# Patient Record
Sex: Female | Born: 1970 | Race: White | Hispanic: No | Smoking: Never smoker
Health system: Southern US, Community
[De-identification: ages and names within clinical notes are randomized; demographics above are authoritative.]

## PROBLEM LIST (undated history)

## (undated) DIAGNOSIS — J45909 Unspecified asthma, uncomplicated: Secondary | ICD-10-CM

## (undated) DIAGNOSIS — R51 Headache: Secondary | ICD-10-CM

## (undated) DIAGNOSIS — G8929 Other chronic pain: Secondary | ICD-10-CM

## (undated) DIAGNOSIS — M1990 Unspecified osteoarthritis, unspecified site: Secondary | ICD-10-CM

## (undated) DIAGNOSIS — E079 Disorder of thyroid, unspecified: Secondary | ICD-10-CM

## (undated) DIAGNOSIS — G43909 Migraine, unspecified, not intractable, without status migrainosus: Secondary | ICD-10-CM

## (undated) DIAGNOSIS — R011 Cardiac murmur, unspecified: Secondary | ICD-10-CM

## (undated) HISTORY — DX: Cardiac murmur, unspecified: R01.1

## (undated) HISTORY — DX: Migraine, unspecified, not intractable, without status migrainosus: G43.909

## (undated) HISTORY — DX: Unspecified asthma, uncomplicated: J45.909

## (undated) HISTORY — DX: Headache: R51

## (undated) HISTORY — DX: Other chronic pain: G89.29

## (undated) HISTORY — DX: Unspecified osteoarthritis, unspecified site: M19.90

## (undated) HISTORY — DX: Disorder of thyroid, unspecified: E07.9

## (undated) HISTORY — PX: BREAST ENHANCEMENT SURGERY: SHX7

---

## 2012-01-11 LAB — BASIC METABOLIC PANEL
BUN: 10 mg/dL (ref 4–21)
CREATININE: 0.7 mg/dL (ref 0.5–1.1)
Glucose: 90 mg/dL
POTASSIUM: 4.7 mmol/L (ref 3.4–5.3)
SODIUM: 139 mmol/L (ref 137–147)

## 2012-01-11 LAB — LIPID PANEL
CHOLESTEROL: 192 mg/dL (ref 0–200)
HDL: 73 mg/dL — AB (ref 35–70)
LDL Cholesterol: 92 mg/dL
Triglycerides: 137 mg/dL (ref 40–160)

## 2012-01-11 LAB — HEPATIC FUNCTION PANEL
ALT: 25 U/L (ref 7–35)
AST: 24 U/L (ref 13–35)

## 2012-01-11 LAB — TSH: TSH: 2.9 u[IU]/mL (ref 0.41–5.90)

## 2012-01-11 LAB — CBC AND DIFFERENTIAL: WBC: 8 10^3/mL

## 2012-05-24 ENCOUNTER — Encounter: Payer: Self-pay | Admitting: Internal Medicine

## 2012-05-24 ENCOUNTER — Ambulatory Visit (INDEPENDENT_AMBULATORY_CARE_PROVIDER_SITE_OTHER): Payer: BC Managed Care – PPO | Admitting: Internal Medicine

## 2012-05-24 VITALS — BP 112/60 | HR 66 | Temp 98.2°F | Ht 63.5 in | Wt 190.0 lb

## 2012-05-24 DIAGNOSIS — E669 Obesity, unspecified: Secondary | ICD-10-CM | POA: Insufficient documentation

## 2012-05-24 DIAGNOSIS — R42 Dizziness and giddiness: Secondary | ICD-10-CM

## 2012-05-24 DIAGNOSIS — E559 Vitamin D deficiency, unspecified: Secondary | ICD-10-CM

## 2012-05-24 DIAGNOSIS — E66811 Obesity, class 1: Secondary | ICD-10-CM

## 2012-05-24 DIAGNOSIS — I10 Essential (primary) hypertension: Secondary | ICD-10-CM | POA: Insufficient documentation

## 2012-05-24 DIAGNOSIS — E039 Hypothyroidism, unspecified: Secondary | ICD-10-CM

## 2012-05-24 DIAGNOSIS — D696 Thrombocytopenia, unspecified: Secondary | ICD-10-CM

## 2012-05-24 LAB — CBC WITH DIFFERENTIAL/PLATELET
Basophils Relative: 0.4 % (ref 0.0–3.0)
Eosinophils Absolute: 0.1 10*3/uL (ref 0.0–0.7)
Hemoglobin: 14.5 g/dL (ref 12.0–15.0)
Lymphs Abs: 3.2 10*3/uL (ref 0.7–4.0)
MCHC: 32.7 g/dL (ref 30.0–36.0)
MCV: 99.5 fl (ref 78.0–100.0)
Monocytes Absolute: 0.5 10*3/uL (ref 0.1–1.0)
Neutro Abs: 4.3 10*3/uL (ref 1.4–7.7)
RBC: 4.46 Mil/uL (ref 3.87–5.11)

## 2012-05-24 MED ORDER — ERGOCALCIFEROL 1.25 MG (50000 UT) PO CAPS: 50000.0000 [IU] | ORAL_CAPSULE | ORAL | Status: DC

## 2012-05-24 NOTE — Assessment & Plan Note (Signed)
Secondary to Hashimoto's thyroiditis.  Dose of medication to be adjusted upward foe goal tsh of 1.0

## 2012-05-24 NOTE — Assessment & Plan Note (Signed)
bp in both arms 138/100,  given her episodes of dizziness, am ordering a 24 hr ambulatory bp monitor

## 2012-05-24 NOTE — Progress Notes (Signed)
Patient ID: Isabella Hester, female   DOB: 1971-05-21, 41 y.o.   MRN: [id number]  Patient Active Problem List  Diagnosis  . Vitamin D deficiency  . Hypothyroidism  . Hypertension  . Obesity (BMI 30.0-34.9)    Subjective:  CC:   Chief Complaint  Patient presents with  . Establish Care    HPI:   Isabella Hester is a 41 y.o. female who presents as a new patient to establish primary care with the chief complaint of   1) Thyroid problems. Saw endocrine for management of hashimoto's thyroiditis  And was taken off medications but having trouble with energy and dizziness.  NOw on a compounded thyroid medication bc Synthroid gave her a terrible headache.,  takes 12.5 mcg T4 twice daily  And her last TSH was 3.0.  She is the daughter of [name].  Relocated here from [place]  After a recent divorce .  2) Overweight, Since the birth of her daughter [name] was born  7.5 yrs ago she has had dificulty losing weight  And maintaining weight loss .   She has gained 20 lbs since then.  Despite working out daily on treadmill and using a Systems analyst since then.  Drinks a protein shake in the morning,  Low carb but adds fruit, including bananas, to it.  Eats 6 small meals daily, low carb.    3) Gets monthly menstrual migraines which returned after giving birth 7 years ago.  Wants to avoid medication .  4) Dizzy and light headed for the last several weeks  Works from home for [employer].    Not orthostatic  .  Bp actually 138/100 in both arms.  Very difficult to hear.    Notes that palpitations  occurred more frequently on higher dose of T4 but cognition was sharper.  Past Medical History  Diagnosis Date  . Asthma   . Arthritis   . Heart murmur   . Chronic headaches   . Migraines   . Thyroid disease     Past Surgical History  Procedure Date  . Breast enhancement surgery     saline    Family History  Problem Relation Age of Onset  . Cancer Father     Lung  . Heart disease Father   . Mental  illness Maternal Grandmother     dementia  . Cancer Paternal Grandmother 67    colon Ca  . Cancer Paternal Grandfather     bone mets unnown primary    History   Social History  . Marital Status: Legally Separated    Spouse Name: N/A    Number of Children: N/A  . Years of Education: N/A   Occupational History  . Not on file.   Social History Main Topics  . Smoking status: Never Smoker   . Smokeless tobacco: Not on file  . Alcohol Use: 0.6 oz/week    1 Glasses of wine per week  . Drug Use: No  . Sexually Active: Not on file   Other Topics Concern  . Not on file   Social History Narrative  . No narrative on file         @ALLHX @    Review of Systems:   The remainder of the review of systems was negative except those addressed in the HPI.       Objective:  BP 112/60  Pulse 66  Temp 98.2 F (36.8 C)  Ht 5' 3.5" (1.613 m)  Wt 190 lb (86.183 kg)  BMI 33.13 kg/m2  SpO2 98%  LMP 05/04/2012  General appearance: alert, cooperative and appears stated age Ears: normal TM's and external ear canals both ears Throat: lips, mucosa, and tongue normal; teeth and gums normal Neck: no adenopathy, no carotid bruit, supple, symmetrical, trachea midline and thyroid not enlarged, symmetric, no tenderness/mass/nodules Back: symmetric, no curvature. ROM normal. No CVA tenderness. Lungs: clear to auscultation bilaterally Heart: regular rate and rhythm, S1, S2 normal, no murmur, click, rub or gallop Abdomen: soft, non-tender; bowel sounds normal; no masses,  no organomegaly Pulses: 2+ and symmetric Skin: Skin color, texture, turgor normal. No rashes or lesions Lymph nodes: Cervical, supraclavicular, and axillary nodes normal.  Assessment and Plan:  Hypothyroidism Secondary to Hashimoto's thyroiditis.  Dose of medication to be adjusted upward foe goal tsh of 1.0  Hypertension bp in both arms 138/100,  given her episodes of dizziness, am ordering a 24 hr ambulatory bp  monitor  Obesity (BMI 30.0-34.9) Patient is already following a low glycemic index diet utilizing smaller more frequent meals to increase metabolism.  She is  exercising daily  a minimum of 5 days per week. Screening for lipid disorders, and diabetes has been done ,  Will adjust thyroid dose.Marland Kitchen     Updated Medication List Outpatient Encounter Prescriptions as of 05/24/2012  Medication Sig Dispense Refill  . ergocalciferol (DRISDOL) 50000 UNITS capsule Take 1 capsule (50,000 Units total) by mouth once a week.  4 capsule  2  . zolmitriptan (ZOMIG) 5 MG tablet Place 5 mg into the nose as needed.         Orders Placed This Encounter  Procedures  . HM MAMMOGRAPHY  . HM PAP SMEAR  . CBC with Differential  . Cortisol  . Ambulatory referral to Cardiology    Return in about 3 months (around 08/24/2012).

## 2012-05-24 NOTE — Assessment & Plan Note (Signed)
Patient is already following a low glycemic index diet utilizing smaller more frequent meals to increase metabolism.  She is  exercising daily  a minimum of 5 days per week. Screening for lipid disorders, and diabetes has been done ,  Will adjust thyroid dose.Marland Kitchen

## 2012-05-24 NOTE — Patient Instructions (Signed)
We will set you up with an ambulatory BP monitor to wear for a full 24 hous to see how much your bp is fluctuating.  This is  Dr. Melina Schools version of a  "Low GI"  Diet:  All of the foods can be found at grocery stores and in bulk at [employer].  The Atkins protein bars and shakes are available in more varieties at [employer], [employer] and [employer].     7 AM Breakfast:  Low carbohydrate Protein  Shakes (I recommend the EAS AdvantEdge "Carb Control" shakes  Or the low carb shakes by Atkins.   Both are available everywhere:  In  cases at [employer]Js  Or in 4 packs at grocery stores and pharmacies  2.5 carbs  (Alternative is  a toasted Arnold's Sandwhich Thin w/ peanut butter, a "Bagel Thin" with cream cheese and salmon) or  a scrambled egg burrito made with a low carb tortilla .  Avoid cereal and bananas, oatmeal too unless you are cooking the old fashioned kind that takes 30-40 minutes to prepare.  the rest is overly processed, has minimal fiber, and is loaded with carbohydrates!   10 AM: Protein bar by Atkins (the snack size, under 200 cal).  There are many varieties , available widely again or in bulk in limited varieties at [employer]Js)  Other so called "protein bars" tend to be loaded with carbohydrates.  Remember, in food advertising, the word "energy" is synonymous for " carbohydrate."  Lunch: sandwich of [place], (or any lunchmeat, grilled meat or canned tuna), fresh avocado, mayonnaise  and cheese on a lower carbohydrate pita bread, flatbread, or tortilla . Ok to use regular mayonnaise. The bread is the only source or carbohydrate that can be decreased (Joseph's makes a pita bread and a flat bread that are 50 cal and 4 net carbs ; Toufayan makes a low carb flatbread that's 100 cal and 9 net carbs  and  Mission makes a low carb whole wheat tortilla  That is 210 cal and 6 net carbs)  3 PM:  Mid day :  Another protein bar,  Or a  cheese stick (100 cal, 0 carbs),  Or 1 ounce of  almonds, walnuts, pistachios, pecans,  peanuts,  Macadamia nuts. Or a Dannon light n Fit greek yogurt, 80 cal 8 net carbs . Avoid "granola"; the dried cranberries and raisins are loaded with carbohydrates. Mixed nuts ok if no raisins or cranberries or dried fruit.      6 PM  Dinner:  "mean and green:"  Meat/chicken/fish or a high protein legume; , with a green salad, and a low GI  Veggie (broccoli, cauliflower, green beans, spinach, brussel sprouts. Lima beans) : Avoid "Low fat dressings, as well as Reyne Dumas and [address]! They are loaded with sugar! Instead use ranch, vinagrette,  Blue cheese, etc  9 PM snack : Breyer's "low carb" fudgsicle or  ice cream bar (Carb Smart line), or  Weight Watcher's ice cream bar , or another "no sugar added" ice cream;a serving of fresh berries/cherries with whipped cream (Avoid bananas, pineapple, grapes  and watermelon on a regular basis because they are high in sugar)   Remember that snack Substitutions should be less than 15 to 20 carbs  Per serving. Remember to subtract fiber grams and sugar alcohols to get the "net carbs."

## 2012-05-25 DIAGNOSIS — D696 Thrombocytopenia, unspecified: Secondary | ICD-10-CM | POA: Insufficient documentation

## 2012-05-25 NOTE — Assessment & Plan Note (Signed)
Confirmed with repeat CBC. Etiology unclear. Referral to hematologist for further testing.

## 2012-05-25 NOTE — Addendum Note (Signed)
Addended by: Sherlene Shams on: 05/25/2012 06:51 AM   Modules accepted: Orders

## 2012-06-07 ENCOUNTER — Ambulatory Visit: Payer: Self-pay | Admitting: Oncology

## 2012-06-07 ENCOUNTER — Encounter (INDEPENDENT_AMBULATORY_CARE_PROVIDER_SITE_OTHER): Payer: BC Managed Care – PPO

## 2012-06-07 DIAGNOSIS — R42 Dizziness and giddiness: Secondary | ICD-10-CM

## 2012-06-07 LAB — CBC CANCER CENTER
Basophil #: 0.1 x10 3/mm (ref 0.0–0.1)
Basophil %: 1 %
Eosinophil #: 0.2 x10 3/mm (ref 0.0–0.7)
Eosinophil %: 2.2 %
HGB: 14.5 g/dL (ref 12.0–16.0)
Lymphocyte %: 32.6 %
MCH: 33 pg (ref 26.0–34.0)
Monocyte #: 0.8 x10 3/mm (ref 0.2–0.9)
Neutrophil %: 54.8 %
Platelet: 142 x10 3/mm — ABNORMAL LOW (ref 150–440)
RBC: 4.41 10*6/uL (ref 3.80–5.20)
RDW: 12.1 % (ref 11.5–14.5)

## 2012-06-07 LAB — IRON AND TIBC
Iron Bind.Cap.(Total): 356 ug/dL (ref 250–450)
Unbound Iron-Bind.Cap.: 294 ug/dL

## 2012-06-07 LAB — LACTATE DEHYDROGENASE: LDH: 185 U/L (ref 81–246)

## 2012-06-20 ENCOUNTER — Ambulatory Visit: Payer: Self-pay | Admitting: Oncology

## 2012-06-21 ENCOUNTER — Telehealth: Payer: Self-pay | Admitting: Internal Medicine

## 2012-06-21 NOTE — Telephone Encounter (Signed)
Patient notified via phone of blood pressure reading

## 2012-06-21 NOTE — Telephone Encounter (Signed)
Her 24 hour blood pressure monitor was normal. Does not need medications at this time.

## 2012-06-30 ENCOUNTER — Telehealth: Payer: Self-pay | Admitting: Internal Medicine

## 2012-06-30 NOTE — Telephone Encounter (Signed)
Her ambulator bp monitor results were normal.  Blood pressure fluctuations were never below normal range except for a brief time at 4 am.

## 2012-06-30 NOTE — Telephone Encounter (Signed)
Spoke to patient gave her instructions as directed.

## 2012-07-12 ENCOUNTER — Ambulatory Visit (INDEPENDENT_AMBULATORY_CARE_PROVIDER_SITE_OTHER): Payer: BC Managed Care – PPO | Admitting: Adult Health

## 2012-07-12 ENCOUNTER — Encounter: Payer: Self-pay | Admitting: Adult Health

## 2012-07-12 VITALS — BP 114/80 | HR 77 | Temp 98.2°F | Ht 63.0 in | Wt 185.0 lb

## 2012-07-12 DIAGNOSIS — R05 Cough: Secondary | ICD-10-CM

## 2012-07-12 MED ORDER — HYDROCODONE-HOMATROPINE 5-1.5 MG/5ML PO SYRP: 5.0000 mL | ORAL_SOLUTION | Freq: Three times a day (TID) | ORAL | Status: DC | PRN

## 2012-07-12 MED ORDER — LEVOFLOXACIN 500 MG PO TABS: 500.0000 mg | ORAL_TABLET | Freq: Every day | ORAL | Status: DC

## 2012-07-12 NOTE — Patient Instructions (Addendum)
  Please have nebulizer treatment today prior to leaving the office. Start your antibiotic tonight.  You can use your albuterol nebulizer at home every 6 hours as needed.  Also, start your inhaler (Qvar) tonight and then use twice daily. Rinse your mouth after each use.  Call if your symptoms do not improve within 3-4 days.  Keep hydrated by drinking fluids throughout the day.

## 2012-07-12 NOTE — Assessment & Plan Note (Addendum)
Suspect bacterial origin given purulent sputum and little resolution of symptoms in 7 days. Albuterol nebulizer treatment given in office and continue at home q 6 hours prn for chest tightness, cough. Start Levaquin. Hycodan for cough.

## 2012-07-12 NOTE — Progress Notes (Signed)
  Subjective:    Patient ID: Isabella Hester, female    DOB: 11-20-1970, 41 y.o.   MRN: [id number]  HPI  Patient is a very pleasant 41 y/o female with hx of asthma, hypothyroidism who presents to clinic with symptoms of general body aches/pain, low grade fever, chills, productive cough of greenish sputum, chest tightness. Cough is worse at night. She has not been able to sleep secondary to this cough. Patient reports that her 63 y/o daughter was sick with similar symptoms. Pt has not been able to get rid of it. She has been taking Aleve for body aches.   Current Outpatient Prescriptions on File Prior to Visit  Medication Sig Dispense Refill  . ergocalciferol (DRISDOL) 50000 UNITS capsule Take 1 capsule (50,000 Units total) by mouth once a week.  4 capsule  2  . zolmitriptan (ZOMIG) 5 MG tablet Place 5 mg into the nose as needed.         Review of Systems  Constitutional: Positive for fever, chills and appetite change.  HENT: Positive for congestion and postnasal drip. Negative for ear pain, rhinorrhea, neck stiffness and ear discharge.   Eyes: Negative.   Respiratory: Positive for cough, chest tightness, shortness of breath and wheezing.   Cardiovascular: Negative for chest pain and palpitations.  Gastrointestinal: Positive for nausea and diarrhea. Negative for vomiting.  Genitourinary: Negative.   Musculoskeletal:       General body aches/pains  Skin: Negative for pallor and rash.  Neurological: Positive for headaches. Negative for dizziness and light-headedness.  Psychiatric/Behavioral: Negative for behavioral problems and agitation. The patient is not nervous/anxious.      BP 114/80  Pulse 77  Temp 98.2 F (36.8 C) (Oral)  Ht 5\' 3"  (1.6 m)  Wt 185 lb (83.915 kg)  BMI 32.77 kg/m2  SpO2 95%  LMP 07/04/2012     Objective:     Physical Exam  Constitutional: She is oriented to person, place, and time. She appears well-developed and well-nourished. No distress.  HENT:   Head: Normocephalic.       Bilateral bulging TM; pharyngeal erythema, cobblestone appearance  Eyes: Conjunctivae normal are normal. Right eye exhibits no discharge. Left eye exhibits no discharge. No scleral icterus.  Neck: No tracheal deviation present.  Cardiovascular: Normal rate, regular rhythm and normal heart sounds.  Exam reveals no gallop.   No murmur heard. Pulmonary/Chest: Effort normal. No respiratory distress. She has wheezes. She exhibits no tenderness.       Positive for rhonchi bilateral posterior fields  Abdominal: Soft. Bowel sounds are normal. There is no tenderness.  Musculoskeletal: Normal range of motion.  Lymphadenopathy:    She has no cervical adenopathy.  Neurological: She is alert and oriented to person, place, and time. Coordination normal.  Skin: Skin is warm and dry. No rash noted. No erythema.  Psychiatric: She has a normal mood and affect. Her behavior is normal. Judgment and thought content normal.       Assessment & Plan:

## 2012-07-13 ENCOUNTER — Telehealth: Payer: Self-pay | Admitting: Adult Health

## 2012-07-13 NOTE — Telephone Encounter (Signed)
Called to check on patient. She reports feeling stomach upset (heartburn, then feeling nauseated) last night with the medication. She took levaquin and also took hycodan. Hard to distinguish which one of these may have contributed to the problem. Instructed to take the levaquin on a full stomach and far enough apart from the hycodan to see if this happens again. She is aware we will not be in the office tomorrow. Advised to call us on Thursday if she is still having problems. Also instructed her to take Zantac 150 mg to see if this lessens stomach upset.

## 2012-07-15 ENCOUNTER — Encounter: Payer: Self-pay | Admitting: Internal Medicine

## 2012-08-21 ENCOUNTER — Other Ambulatory Visit: Payer: Self-pay | Admitting: Internal Medicine

## 2012-09-04 ENCOUNTER — Other Ambulatory Visit: Payer: Self-pay

## 2012-09-09 ENCOUNTER — Ambulatory Visit: Payer: Self-pay | Admitting: Oncology

## 2012-11-20 ENCOUNTER — Other Ambulatory Visit: Payer: Self-pay | Admitting: Internal Medicine

## 2013-03-31 ENCOUNTER — Other Ambulatory Visit: Payer: Self-pay | Admitting: Internal Medicine

## 2013-05-26 ENCOUNTER — Other Ambulatory Visit: Payer: Self-pay

## 2013-07-23 ENCOUNTER — Other Ambulatory Visit: Payer: Self-pay | Admitting: Internal Medicine

## 2013-10-10 ENCOUNTER — Telehealth: Payer: Self-pay | Admitting: Internal Medicine

## 2013-10-10 NOTE — Telephone Encounter (Signed)
Nope, they can do that ,  I have never done it .

## 2013-10-10 NOTE — Telephone Encounter (Signed)
Please advise 

## 2013-10-10 NOTE — Telephone Encounter (Signed)
Pt called stating she was having her breast implants removed and they told her that her primary care dr could take the drainage tubes out Her surgery date is 12/02/13  She stated she would need to get the tubes removed 12/12/13

## 2013-10-11 NOTE — Telephone Encounter (Signed)
Patient notified not performed in this office.

## 2013-10-27 ENCOUNTER — Ambulatory Visit: Payer: Self-pay | Admitting: Oncology

## 2013-10-28 LAB — CBC CANCER CENTER
BASOS ABS: 0 x10 3/mm (ref 0.0–0.1)
BASOS PCT: 0.5 %
EOS ABS: 0.1 x10 3/mm (ref 0.0–0.7)
Eosinophil %: 1.5 %
HCT: 42.5 % (ref 35.0–47.0)
HGB: 14.2 g/dL (ref 12.0–16.0)
LYMPHS PCT: 38.7 %
Lymphocyte #: 3 x10 3/mm (ref 1.0–3.6)
MCH: 32.3 pg (ref 26.0–34.0)
MCHC: 33.4 g/dL (ref 32.0–36.0)
MCV: 97 fL (ref 80–100)
Monocyte #: 0.5 x10 3/mm (ref 0.2–0.9)
Monocyte %: 6.1 %
NEUTROS ABS: 4.1 x10 3/mm (ref 1.4–6.5)
Neutrophil %: 53.2 %
Platelet: 152 x10 3/mm (ref 150–440)
RBC: 4.39 10*6/uL (ref 3.80–5.20)
RDW: 11.7 % (ref 11.5–14.5)
WBC: 7.7 x10 3/mm (ref 3.6–11.0)

## 2013-11-18 ENCOUNTER — Ambulatory Visit: Payer: Self-pay | Admitting: Oncology

## 2014-03-30 LAB — BASIC METABOLIC PANEL
BUN: 11 mg/dL (ref 4–21)
Creatinine: 0.8 mg/dL (ref 0.5–1.1)
Glucose: 99 mg/dL
Potassium: 4.7 mmol/L (ref 3.4–5.3)
Sodium: 139 mmol/L (ref 137–147)

## 2014-03-30 LAB — HEPATIC FUNCTION PANEL
ALT: 44 U/L — AB (ref 7–35)
AST: 34 U/L (ref 13–35)
Alkaline Phosphatase: 33 U/L (ref 25–125)

## 2014-03-30 LAB — TSH: TSH: 2.27 u[IU]/mL (ref 0.41–5.90)

## 2014-03-30 LAB — CBC AND DIFFERENTIAL
Hemoglobin: 14.5 g/dL (ref 12.0–16.0)
Platelets: 157 10*3/uL (ref 150–399)
WBC: 6.4 10^3/mL

## 2014-05-26 ENCOUNTER — Telehealth: Payer: Self-pay | Admitting: Internal Medicine

## 2014-05-26 MED ORDER — RIZATRIPTAN BENZOATE 5 MG PO TABS: 5.0000 mg | ORAL_TABLET | ORAL | Status: DC | PRN

## 2014-05-26 NOTE — Telephone Encounter (Signed)
Please advise 

## 2014-05-26 NOTE — Telephone Encounter (Signed)
Pt sent to scheduling for appoint. Aware of Rx

## 2014-05-26 NOTE — Telephone Encounter (Signed)
Isabella Hester called saying she has migraines and has run out of her medication. She's currently taking Zomig but would like to try Maxalt. She said she's heard good reviews about the medication. I told her the first appt I saw would be on Dec. 23rd and she's wondering if she can be seen before then. Her last migraine "episode" last three days in a row when she took her last Zomig. Please call the patient. Pt ph# [phone] Thank you.

## 2014-05-26 NOTE — Telephone Encounter (Signed)
No I cannot work her in any earlier,  Because she has not been seen in over 2 years. . I will send the maxAlt to her pharmacy but she needs to keep the dec 23 appt

## 2014-05-29 ENCOUNTER — Telehealth: Payer: Self-pay | Admitting: *Deleted

## 2014-05-29 NOTE — Telephone Encounter (Signed)
[facility] Left detailed message for patient.

## 2014-07-12 ENCOUNTER — Ambulatory Visit (INDEPENDENT_AMBULATORY_CARE_PROVIDER_SITE_OTHER): Payer: BC Managed Care – PPO | Admitting: Internal Medicine

## 2014-07-12 ENCOUNTER — Encounter: Payer: Self-pay | Admitting: Internal Medicine

## 2014-07-12 VITALS — BP 110/78 | HR 84 | Temp 99.1°F | Resp 14 | Ht 63.0 in | Wt 194.2 lb

## 2014-07-12 DIAGNOSIS — G43009 Migraine without aura, not intractable, without status migrainosus: Secondary | ICD-10-CM

## 2014-07-12 DIAGNOSIS — E038 Other specified hypothyroidism: Secondary | ICD-10-CM

## 2014-07-12 DIAGNOSIS — Z9882 Breast implant status: Secondary | ICD-10-CM

## 2014-07-12 DIAGNOSIS — R030 Elevated blood-pressure reading, without diagnosis of hypertension: Secondary | ICD-10-CM

## 2014-07-12 DIAGNOSIS — Z9109 Other allergy status, other than to drugs and biological substances: Secondary | ICD-10-CM

## 2014-07-12 DIAGNOSIS — T8543XS Leakage of breast prosthesis and implant, sequela: Secondary | ICD-10-CM

## 2014-07-12 DIAGNOSIS — D696 Thrombocytopenia, unspecified: Secondary | ICD-10-CM

## 2014-07-12 DIAGNOSIS — E669 Obesity, unspecified: Secondary | ICD-10-CM

## 2014-07-12 MED ORDER — LEVOTHYROXINE SODIUM 75 MCG PO TABS: 75.0000 ug | ORAL_TABLET | Freq: Every day | ORAL | Status: DC

## 2014-07-12 MED ORDER — DIAZEPAM 5 MG PO TABS: 5.0000 mg | ORAL_TABLET | Freq: Two times a day (BID) | ORAL | Status: DC | PRN

## 2014-07-12 MED ORDER — ONDANSETRON 8 MG PO TBDP: 8.0000 mg | ORAL_TABLET | Freq: Three times a day (TID) | ORAL | Status: DC | PRN

## 2014-07-12 MED ORDER — ELETRIPTAN HYDROBROMIDE 40 MG PO TABS: 40.0000 mg | ORAL_TABLET | Freq: Once | ORAL | Status: DC

## 2014-07-12 MED ORDER — METOPROLOL TARTRATE 25 MG PO TABS: 25.0000 mg | ORAL_TABLET | Freq: Every day | ORAL | Status: DC

## 2014-07-12 NOTE — Assessment & Plan Note (Signed)
24 hour ambulatory monitor of BP  Report done in  2013 was normal

## 2014-07-12 NOTE — Patient Instructions (Addendum)
We are starting metoprolol 25 mg at bedtime for migraine prevention   Trial of  valium for muscle tension at onset of headache  5 mg   relpax max dose at onset of headache  increase  levothyroxine to 75 mcg ,  1.5 tablets and recheck tsh in 6 week  Trial of allegra for mold allergy  (180 mg is the average  Dose for allergies )

## 2014-07-12 NOTE — Progress Notes (Signed)
Patient ID: Isabella Hester, female   DOB: Sep 14, 1970, 43 y.o.   MRN: [id number]  Patient Active Problem List   Diagnosis Date Noted  . Migraine without aura and without status migrainosus, not intractable 07/15/2014  . S/P silicone breast implant 07/15/2014  . 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 Elevated blood-pressure reading without diagnosis of hypertension 07/12/2014  . Thrombocytopenia 05/25/2012  . Vitamin D deficiency 05/24/2012  . Hypothyroidism 05/24/2012  . Obesity (BMI 30.0-34.9) 05/24/2012    Subjective:  CC:   Chief Complaint  Patient presents with  . Follow-up    last seen 07/12/12, having migraine headaches    HPI:   Isabella Hester is a 43 y.o. female who presents for  Follow up on multiple issues including migraine headaches, hypothyroidism, Weight gain,  And thrombocytopenia.  Last seen in 2013     She continues to have Monthly migraines, lasting 3 days , accompanied by  nausea and vomiting.  Previously managed with nasal imitrex , requesting either a refill or alternative.  maxalt trial was unsuccessful.  Long history of  Generalized persistent  Malaise leading to significant  weight gain attributed to systemic reaction leaking silicone breast implants from 2003 which were  finally removed in April 2015.  By Domenica Fail, MD in [place].   Patient was treated by same surgeon with with nystatin oral  suspension and lamisil oral tablets for 6 weeks  according some detox protocol. However, patient stopped the medications after several weeks due to feeling bloated and wt gain of 25 lbs.  She has recently started to feel better, but continues to have monthly migraines , which  Started in puberty,  Triggered by smells including burning wood and mold. Currently migraines occurring  with onset of her menstrual period and are preceded by bilateral shoulder tension,  Then an occipital headache followed by migraine and and  nausea.    She was evaluated by Dr. Orlie Dakin in 2013 for thrombocytopenia  And again in 2015.  She does not agree with his diagnosis .  This was discussed today   Past Medical History  Diagnosis Date  . Asthma   . Arthritis   . Heart murmur   . Chronic headaches   . Migraines   . Thyroid disease     Past Surgical History  Procedure Laterality Date  . Breast enhancement surgery      saline       The following portions of the patient's history were reviewed and updated as appropriate: Allergies, current medications, and problem list.    Review of Systems:   Patient denies headache, fevers, malaise, unintentional weight loss, skin rash, eye pain, sinus congestion and sinus pain, sore throat, dysphagia,  hemoptysis , cough, dyspnea, wheezing, chest pain, palpitations, orthopnea, edema, abdominal pain, nausea, melena, diarrhea, constipation, flank pain, dysuria, hematuria, urinary  Frequency, nocturia, numbness, tingling, seizures,  Focal weakness, Loss of consciousness,  Tremor, insomnia, depression, anxiety, and suicidal ideation.     History   Social History  . Marital Status: Legally Separated    Spouse Name: N/A    Number of Children: N/A  . Years of Education: N/A   Occupational History  . Not on file.   Social History Main Topics  . Smoking status: Never Smoker   . Smokeless tobacco: Not on file  . Alcohol Use: 0.6 oz/week    1 Glasses of wine per week  . Drug Use: No  . Sexual  Activity: Not on file   Other Topics Concern  . Not on file   Social History Narrative    Objective:  Filed Vitals:   07/12/14 1616  BP: 110/78  Pulse: 84  Temp: 99.1 F (37.3 C)  Resp: 14     General appearance: alert, cooperative and appears stated age Ears: normal TM's and external ear canals both ears Throat: lips, mucosa, and tongue normal; teeth and gums normal Neck: no adenopathy, no carotid bruit, supple, symmetrical, trachea midline and thyroid not enlarged,  symmetric, no tenderness/mass/nodules Back: symmetric, no curvature. ROM normal. No CVA tenderness. Lungs: clear to auscultation bilaterally Heart: regular rate and rhythm, S1, S2 normal, no murmur, click, rub or gallop Abdomen: soft, non-tender; bowel sounds normal; no masses,  no organomegaly Pulses: 2+ and symmetric Skin: Skin color, texture, turgor normal. No rashes or lesions Lymph nodes: Cervical, supraclavicular, and axillary nodes normal.  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 Christian Mateim Finnegan , after comprehensivve workup was negative for cause.  She was  reevaluated in 2015 after her plastic surgeon noted a drop to 40K preoperatively.  It is unclear whether the drop was due to collection in the wrong tube.  Dr. Orlie DakinFinnegan noted complete resolution when sample was collected in a citrate tube    Elevated blood-pressure reading without diagnosis of hypertension 24 hour ambulatory monitor of BP  Report done in  2013 was normal   Hypothyroidism Patient takes T3 and t3 ,  Previously managed by a PA an an outside clinic.  recent TSH is 2.27,    Will increase Synthroid to 75 mcg daily  To see if we can get her TSH to 1.0 to improve her metabolism and energy level. Repeat tsh in 6 weeks.    Migraine without aura and without status migrainosus, not intractable Occurring monthly and causing 3 days per moth of productivity loss due to severe symptoms. Discussed trial of nightly  metoprolol for prevention , relpax and valium for abortion.  Zofran for nausea  Obesity (BMI 30.0-34.9) I have addressed  BMI and recommended wt loss of 10% of body weigh over the next 6 months using a low glycemic index diet and regular exercise a minimum of 5 days per week.  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 Patient was also treated with an antifungal protocol that involved nystatin and lamasil, which she did not complete.  Has had LFTs since then in September which noted mild ALt elevation.  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 Updated Medication List Outpatient Encounter Prescriptions as of 07/12/2014  Medication Sig  . Ascorbic Acid (VITAMIN C) 1000 MG tablet Take 1,000 mg by mouth daily.  . Cholecalciferol ([facility] VIT D 5000 HIGH-POTENCY PO) Take 1 capsule by mouth daily.  Marland Kitchen. levothyroxine (SYNTHROID, LEVOTHROID) 75 MCG tablet Take 1 tablet (75  mcg total) by mouth daily before breakfast.  . zolmitriptan (ZOMIG) 5 MG tablet Place 5 mg into the nose as needed.  . [DISCONTINUED] levothyroxine (SYNTHROID, LEVOTHROID) 50 MCG tablet Take 50 mcg by mouth daily before breakfast.  . [DISCONTINUED] rizatriptan (MAXALT) 5 MG tablet Take 1 tablet (5 mg total) by mouth as needed for migraine. May repeat in 2 hours if needed  . diazepam (VALIUM) 5 MG tablet Take 1 tablet (5 mg total) by mouth every 12 (twelve) hours as needed for anxiety.  Marland Kitchen. eletriptan (RELPAX) 40 MG tablet Take 1 tablet (40 mg total) by mouth once. One tablet by mouth at onset of headache.  . metoprolol tartrate (LOPRESSOR) 25 MG tablet Take 1 tablet (25 mg total) by mouth at bedtime.  . ondansetron (ZOFRAN ODT) 8 MG disintegrating tablet Take 1 tablet (8 mg total) by mouth every 8 (eight) hours as needed for nausea or vomiting.  . [DISCONTINUED] HYDROcodone-homatropine (HYCODAN) 5-1.5 MG/5ML syrup Take 5 mLs by mouth every 8 (eight) hours as needed  for cough.  . [DISCONTINUED] levofloxacin (LEVAQUIN) 500 MG tablet Take 1 tablet (500 mg total) by mouth daily. Take 1 tablet daily for 10 days.  . [DISCONTINUED] Vitamin D, Ergocalciferol, (DRISDOL) 50000 UNITS CAPS capsule TAKE 1 CAPSULE (50,000 UNITS TOTAL) BY MOUTH ONCE A WEEK.     Orders Placed This Encounter  Procedures  . TSH  . CBC and differential  . Basic metabolic panel  . Lipid panel  . Hepatic function panel  . T4 AND TSH  . CBC and differential  . Basic metabolic panel  . Hepatic function panel  . TSH    No Follow-up on file.

## 2014-07-12 NOTE — Assessment & Plan Note (Addendum)
Secondary to platelet aggregation per  2013 by Dr. Christian Mateim Finnegan , after comprehensivve workup was negative for cause.  She was  reevaluated in 2015 after her plastic surgeon noted a drop to 40K preoperatively.  It is unclear whether the drop was due to collection in the wrong tube.  Dr. Orlie DakinFinnegan noted complete resolution when sample was collected in a citrate tube

## 2014-07-15 DIAGNOSIS — G43009 Migraine without aura, not intractable, without status migrainosus: Secondary | ICD-10-CM | POA: Insufficient documentation

## 2014-07-15 DIAGNOSIS — T8543XA Leakage of breast prosthesis and implant, initial encounter: Secondary | ICD-10-CM | POA: Insufficient documentation

## 2014-07-15 DIAGNOSIS — Z9882 Breast implant status: Secondary | ICD-10-CM | POA: Insufficient documentation

## 2014-07-15 DIAGNOSIS — Z9109 Other allergy status, other than to drugs and biological substances: Secondary | ICD-10-CM | POA: Insufficient documentation

## 2014-07-15 NOTE — Assessment & Plan Note (Addendum)
Patient takes T3 and t3 ,  Previously managed by a PA an an outside clinic.  recent TSH is 2.27,    Will increase Synthroid to 75 mcg daily  To see if we can get her TSH to 1.0 to improve her metabolism and energy level. Repeat tsh in 6 weeks.

## 2014-07-15 NOTE — Assessment & Plan Note (Signed)
Occurring monthly and causing 3 days per moth of productivity loss due to severe symptoms. Discussed trial of nightly  metoprolol for prevention , relpax and valium for abortion.  Zofran for nausea

## 2014-07-15 NOTE — Assessment & Plan Note (Signed)
S/p [facility]. Patient was also treated with an antifungal protocol that involved nystatin and lamasil, which she did not complete.  Has had LFTs since then in September which noted mild ALt elevation.  Lab Results  Component Value Date   ALT 44* 03/30/2014   AST 34 03/30/2014   ALKPHOS 33 03/30/2014

## 2014-07-15 NOTE — Assessment & Plan Note (Addendum)
I have addressed  BMI and recommended wt loss of 10% of body weigh over the next 6 months using a low glycemic index diet and regular exercise a minimum of 5 days per week.  Wt Readings from Last 3 Encounters:  07/12/14 194 lb 4 oz (88.111 kg)  07/12/12 185 lb (83.915 kg)  05/24/12 190 lb (86.183 kg)

## 2014-07-15 NOTE — Assessment & Plan Note (Signed)
Advised to try Allegra 180 mg daily

## 2014-08-28 ENCOUNTER — Other Ambulatory Visit: Payer: Self-pay

## 2014-08-28 ENCOUNTER — Ambulatory Visit: Payer: Self-pay | Admitting: Internal Medicine

## 2014-08-30 ENCOUNTER — Encounter: Payer: Self-pay | Admitting: Internal Medicine

## 2014-08-30 MED ORDER — ZOLMITRIPTAN 5 MG NA SOLN: 1.0000 | NASAL | Status: DC | PRN

## 2014-09-21 ENCOUNTER — Ambulatory Visit: Payer: Self-pay | Admitting: Family Medicine

## 2015-03-27 LAB — CBC AND DIFFERENTIAL
HCT: 42 % (ref 36–46)
Hemoglobin: 14.3 g/dL (ref 12.0–16.0)
Neutrophils Absolute: 4 /uL
PLATELETS: 123 10*3/uL — AB (ref 150–399)
WBC: 7 10*3/mL

## 2015-03-27 LAB — BASIC METABOLIC PANEL
BUN: 11 mg/dL (ref 4–21)
CREATININE: 0.7 mg/dL (ref 0.5–1.1)
GLUCOSE: 92 mg/dL
POTASSIUM: 4.6 mmol/L (ref 3.4–5.3)
SODIUM: 141 mmol/L (ref 137–147)

## 2015-03-27 LAB — TSH: TSH: 3.22 u[IU]/mL (ref 0.41–5.90)

## 2015-03-27 LAB — LIPID PANEL
Cholesterol: 229 mg/dL — AB (ref 0–200)
HDL: 65 mg/dL (ref 35–70)
LDL CALC: 134 mg/dL
TRIGLYCERIDES: 152 mg/dL (ref 40–160)

## 2015-03-27 LAB — HEPATIC FUNCTION PANEL
ALT: 16 U/L (ref 7–35)
AST: 20 U/L (ref 13–35)
Alkaline Phosphatase: 40 U/L (ref 25–125)
BILIRUBIN, TOTAL: 0.2 mg/dL

## 2015-03-27 LAB — HEMOGLOBIN A1C: HEMOGLOBIN A1C: 5.4 % (ref 4.0–6.0)

## 2015-03-30 ENCOUNTER — Other Ambulatory Visit: Payer: Self-pay | Admitting: Internal Medicine

## 2015-03-30 NOTE — Telephone Encounter (Signed)
Last TSH 9.10.16.  Never returned for labs ordered 12.23.15.  Please advise refill

## 2015-03-31 NOTE — Telephone Encounter (Signed)
correction last tsh 03/30/14.  Refill one month only needs

## 2015-04-02 NOTE — Telephone Encounter (Signed)
Left message on VM to return call 

## 2015-06-01 ENCOUNTER — Other Ambulatory Visit: Payer: Self-pay | Admitting: Internal Medicine

## 2015-06-01 DIAGNOSIS — I10 Essential (primary) hypertension: Secondary | ICD-10-CM

## 2015-06-01 DIAGNOSIS — E034 Atrophy of thyroid (acquired): Secondary | ICD-10-CM

## 2015-06-01 DIAGNOSIS — E785 Hyperlipidemia, unspecified: Secondary | ICD-10-CM

## 2015-06-01 DIAGNOSIS — E559 Vitamin D deficiency, unspecified: Secondary | ICD-10-CM

## 2015-06-01 NOTE — Telephone Encounter (Signed)
Refill for 30 days only.  FASTING LABS AND OFFICE VISIT NEEDED or to any more refills

## 2015-06-01 NOTE — Telephone Encounter (Signed)
Please advise refill? 

## 2015-07-02 ENCOUNTER — Other Ambulatory Visit: Payer: Self-pay | Admitting: Internal Medicine

## 2015-07-03 ENCOUNTER — Telehealth: Payer: Self-pay | Admitting: Internal Medicine

## 2015-07-03 MED ORDER — LEVOTHYROXINE SODIUM 75 MCG PO TABS: ORAL_TABLET | ORAL | Status: DC

## 2015-07-03 NOTE — Telephone Encounter (Signed)
Pt came in to drop off lab results placed in Dr. Ceasar Lundullos box.. Please advise pt if you have any questions..Marland Kitchen

## 2015-07-03 NOTE — Telephone Encounter (Signed)
IN yellow folder abstracted this is the patient whom was requesting refill on levothyroxine see note 07/02/15 refill.

## 2015-07-03 NOTE — Telephone Encounter (Signed)
Patient has an appointment scheduled for 07/09/15 for medication OV, patient has recently had labs and is going to bring copy to  Office. FYI

## 2015-07-03 NOTE — Telephone Encounter (Signed)
Refill denied because it appears that  we have already done the "30 days only"  Document that patient has been called,  Labs have been ordered

## 2015-07-09 ENCOUNTER — Encounter: Payer: Self-pay | Admitting: Internal Medicine

## 2015-07-09 ENCOUNTER — Ambulatory Visit (INDEPENDENT_AMBULATORY_CARE_PROVIDER_SITE_OTHER): Payer: BLUE CROSS/BLUE SHIELD | Admitting: Internal Medicine

## 2015-07-09 VITALS — BP 122/84 | HR 88 | Temp 98.0°F | Resp 12 | Ht 63.0 in | Wt 194.5 lb

## 2015-07-09 DIAGNOSIS — Z9109 Other allergy status, other than to drugs and biological substances: Secondary | ICD-10-CM | POA: Diagnosis not present

## 2015-07-09 DIAGNOSIS — B999 Unspecified infectious disease: Secondary | ICD-10-CM | POA: Diagnosis not present

## 2015-07-09 DIAGNOSIS — E038 Other specified hypothyroidism: Secondary | ICD-10-CM

## 2015-07-09 DIAGNOSIS — E034 Atrophy of thyroid (acquired): Secondary | ICD-10-CM

## 2015-07-09 DIAGNOSIS — D696 Thrombocytopenia, unspecified: Secondary | ICD-10-CM

## 2015-07-09 DIAGNOSIS — E669 Obesity, unspecified: Secondary | ICD-10-CM

## 2015-07-09 MED ORDER — NYSTATIN-TRIAMCINOLONE 100000-0.1 UNIT/GM-% EX OINT: 1.0000 "application " | TOPICAL_OINTMENT | Freq: Two times a day (BID) | CUTANEOUS | Status: DC

## 2015-07-09 MED ORDER — FEXOFENADINE HCL 180 MG PO TABS: 180.0000 mg | ORAL_TABLET | Freq: Every day | ORAL | Status: AC

## 2015-07-09 MED ORDER — MONTELUKAST SODIUM 10 MG PO TABS: 10.0000 mg | ORAL_TABLET | Freq: Every day | ORAL | Status: DC

## 2015-07-09 NOTE — Progress Notes (Signed)
Pre-visit discussion using our clinic review tool. No additional management support is needed unless otherwise documented below in the visit note.  

## 2015-07-09 NOTE — Patient Instructions (Addendum)
I suggest trying Allegra and Singulair (generic forms of both) on a daily basis to see if it helps modulate your inflammatory response to the mold.  I am checking your IgG, IgA, and IgM levels today to evaluate your immune system

## 2015-07-09 NOTE — Progress Notes (Signed)
Subjective:  Patient ID: Isabella Hester, female    DOB: Aug 12, 1970  Age: 44 y.o. MRN: [id number]  CC: The primary encounter diagnosis was Recurrent infections. 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  HPI Isabella Hester presents for one year follow up on multiple issues, including hypothyroidism, migraine headaches, obesity and hypertension.  Not taking .  Migraines worse over the weekend.  Never started the lopressor for headache prevention. ,  Didn't have any until recently,  Seeing a chiropractor. Who has been treating her thyroid with a diet.  Still taking levothyroxine. Also treating her  For  EBV .   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  Surgeon thinks it is an immune response  to mold . Takes a lot of natural immune system enhancers including [employer]  And a mushroom extract (cytokine production ), which she reports helps her feel better,   if she goes without it she gets sick   Wants to have breast topography rather than mammogram due to pain issues.  and PAP smear  No history of abnormals.   Outpatient Prescriptions Prior to Visit  Medication Sig Dispense Refill  . Ascorbic Acid (VITAMIN C) 1000 MG tablet Take 1,000 mg by mouth daily.    . Cholecalciferol ([employer] VIT D 5000 HIGH-POTENCY PO) Take 1 capsule by mouth daily.    Marland Kitchen levothyroxine (SYNTHROID, LEVOTHROID) 75 MCG tablet TAKE 1 TABLET BY MOUTH EVERY MORNING BEFORE BREAKFAST 90 tablet 0  . ondansetron (ZOFRAN ODT) 8 MG disintegrating tablet Take 1 tablet (8 mg total) by mouth every 8 (eight) hours as needed for nausea or vomiting. 20 tablet 0  . zolmitriptan (ZOMIG) 5 MG nasal solution Place 1 spray into the nose as needed for migraine. 6 Units 5  . diazepam (VALIUM) 5 MG tablet Take 1 tablet (5 mg total) by mouth every 12 (twelve)  hours as needed for anxiety. 30 tablet 1  . metoprolol tartrate (LOPRESSOR) 25 MG tablet Take 1 tablet (25 mg total) by mouth at bedtime. (Patient not taking: Reported on 07/09/2015) 90 tablet 3  . eletriptan (RELPAX) 40 MG tablet Take 1 tablet (40 mg total) by mouth once. One tablet by mouth at onset of headache. (Patient not taking: Reported on 07/09/2015) 10 tablet 0  . liothyronine (CYTOMEL) 5 MCG tablet TAKE 1 TABLET BY MOUTH TWICE A DAY (Patient not taking: Reported on 07/09/2015) 60 tablet 0   No facility-administered medications prior to visit.    Review of Systems;  Patient denies , fevers,  unintentional weight loss, skin rash, eye pain, sinus congestion and sinus pain, sore throat, dysphagia,  hemoptysis , cough, dyspnea, wheezing, chest pain, palpitations, orthopnea, edema, abdominal pain, nausea, melena, diarrhea, constipation, flank pain, dysuria, hematuria, urinary  Frequency, nocturia, numbness, tingling, seizures,  Focal weakness, Loss of consciousness,  Tremor, insomnia, depression, anxiety, and suicidal ideation.      Objective:  BP 122/84 mmHg  Pulse 88  Temp(Src) 98 F (36.7 C) (Oral)  Resp 12  Ht  (1.6 m)  Wt 194 lb 8 oz (88.225 kg)  BMI 34.46 kg/m2  SpO2 98%  LMP 06/12/2015 (Approximate)  BP Readings from Last 3 Encounters:  07/09/15 122/84  07/12/14 110/78  07/12/12 114/80    Wt Readings from Last 3 Encounters:  07/09/15 194 lb 8 oz (88.225 kg)  07/12/14  194 lb 4 oz (88.111 kg)  07/12/12 185 lb (83.915 kg)    General appearance: alert, cooperative and appears stated age Ears: normal TM's and external ear canals both ears Throat: lips, mucosa, and tongue normal; teeth and gums normal Neck: no adenopathy, no carotid bruit, supple, symmetrical, trachea midline and thyroid not enlarged, symmetric, no tenderness/mass/nodules Back: symmetric, no curvature. ROM normal. No CVA tenderness. Lungs: clear to auscultation bilaterally Heart: regular rate and  rhythm, S1, S2 normal, no murmur, click, rub or gallop Abdomen: soft, non-tender; bowel sounds normal; no masses,  no organomegaly Pulses: 2+ and symmetric Skin: Skin color, texture, turgor normal. No rashes or lesions Lymph nodes: Cervical, supraclavicular, and axillary nodes normal.  Lab Results  Component Value Date   HGBA1C 5.4 03/27/2015    Lab Results  Component Value Date   CREATININE 0.7 03/27/2015   CREATININE 0.8 03/30/2014   CREATININE 0.7 01/11/2012    Lab Results  Component Value Date   WBC 7.0 03/27/2015   HGB 14.3 03/27/2015   HCT 42 03/27/2015   PLT 123* 03/27/2015   CHOL 229* 03/27/2015   TRIG 152 03/27/2015   HDL 65 03/27/2015   LDLCALC 134 03/27/2015   ALT 16 03/27/2015   AST 20 03/27/2015   NA 141 03/27/2015   K 4.6 03/27/2015   CREATININE 0.7 03/27/2015   BUN 11 03/27/2015   TSH 3.22 03/27/2015   HGBA1C 5.4 03/27/2015    No results found.  Assessment & Plan:   Problem List Items Addressed This Visit    Hypothyroidism    Thyroid function is WNL on current dose.  No current changes needed.  Lab Results  Component Value Date   TSH 3.22 03/27/2015         Obesity (BMI 30.0-34.9)    Reviewed her previous success at weight loss,  Her goal weight for BMI < 30 (168 lbs). I have addressed  BMI and recommended wt loss of 10% of body weigh over the next 6 months using a low glycemic index diet and regular exercise a minimum of 5 days per week.        Thrombocytopenia (HCC)    Mild, Secondary to platelet aggregation per  2013 by Dr. Christian Mate , after comprehensivve workup was negative for cause.  She was  reevaluated in 2015 after her plastic surgeon noted a drop to 40K preoperatively.  It is unclear whether the drop was due to collection in the wrong tube.  Dr. 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  Checking Ig levels to determine if there is any basis for her concerns.        Relevant Orders   IgG, IgA, IgM     A total of 40 minutes was spent with patient more than half of which was spent in counseling patient on the above mentioned issues , reviewing  recent labs and imaging studies done, and coordination of care.  I have discontinued Ms. Zwiefelhofer's eletriptan, diazepam, and  liothyronine. I am also having her start on nystatin-triamcinolone ointment, montelukast, and fexofenadine. Additionally, I am having her maintain her Cholecalciferol ([employer]S VIT D 5000 HIGH-POTENCY PO), vitamin C, metoprolol tartrate, ondansetron, zolmitriptan, and levothyroxine.  Meds ordered this encounter  Medications  . nystatin-triamcinolone ointment (MYCOLOG)    Sig: Apply 1 application topically 2 (two) times daily.    Dispense:  30 g    Refill:  0  . montelukast (SINGULAIR) 10 MG tablet    Sig: Take 1 tablet (10 mg total) by mouth at bedtime.    Dispense:  30 tablet    Refill:  3  . fexofenadine (ALLEGRA) 180 MG tablet    Sig: Take 1 tablet (180 mg total) by mouth daily.    Dispense:  30 tablet    Refill:  3    Medications Discontinued During This Encounter  Medication Reason  . liothyronine (CYTOMEL) 5 MCG tablet   . diazepam (VALIUM) 5 MG tablet   . eletriptan (RELPAX) 40 MG tablet     Follow-up: Return in about 3 months (around 10/07/2015) for PAP smear.   Sherlene ShamsULLO, Hughie Melroy L, MD

## 2015-07-11 ENCOUNTER — Encounter: Payer: Self-pay | Admitting: Internal Medicine

## 2015-07-11 DIAGNOSIS — B999 Unspecified infectious disease: Secondary | ICD-10-CM | POA: Insufficient documentation

## 2015-07-11 LAB — IGG, IGA, IGM
IGG (IMMUNOGLOBIN G), SERUM: 1130 mg/dL (ref 690–1700)
IgA: 270 mg/dL (ref 69–380)
IgM, Serum: 79 mg/dL (ref 52–322)

## 2015-07-11 NOTE — Assessment & Plan Note (Signed)
Mild, Secondary to platelet aggregation per  2013 by Dr. Christian Mateim Finnegan , after comprehensivve workup was negative for cause.  She was  reevaluated in 2015 after her plastic surgeon noted a drop to 40K preoperatively.  It is unclear whether the drop was due to collection in the wrong tube.  Dr. Orlie DakinFinnegan noted complete resolution when sample was collected in a citrate tube    Lab Results  Component Value Date   WBC 7.0 03/27/2015   HGB 14.3 03/27/2015   HCT 42 03/27/2015   MCV 97 10/28/2013   PLT 123* 03/27/2015

## 2015-07-11 NOTE — Assessment & Plan Note (Signed)
Reviewed her previous success at weight loss,  Her goal weight for BMI < 30 (168 lbs). I have addressed  BMI and recommended wt loss of 10% of body weigh over the next 6 months using a low glycemic index diet and regular exercise a minimum of 5 days per week.

## 2015-07-11 NOTE — Assessment & Plan Note (Signed)
Thyroid function is WNL on current dose.  No current changes needed.  Lab Results  Component Value Date   TSH 3.22 03/27/2015

## 2015-07-11 NOTE — Assessment & Plan Note (Signed)
Recommending adding fexofenadine and singulair.

## 2015-07-11 NOTE — Assessment & Plan Note (Addendum)
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  Checking Ig levels to determine if there is any basis for her concerns.

## 2015-07-12 ENCOUNTER — Encounter: Payer: Self-pay | Admitting: Internal Medicine

## 2015-07-25 ENCOUNTER — Ambulatory Visit: Payer: Self-pay | Admitting: Internal Medicine

## 2015-09-28 ENCOUNTER — Other Ambulatory Visit: Payer: Self-pay | Admitting: Internal Medicine

## 2015-09-30 ENCOUNTER — Other Ambulatory Visit: Payer: Self-pay | Admitting: Internal Medicine

## 2015-10-10 ENCOUNTER — Encounter: Payer: Self-pay | Admitting: Internal Medicine

## 2015-10-10 ENCOUNTER — Ambulatory Visit (INDEPENDENT_AMBULATORY_CARE_PROVIDER_SITE_OTHER): Payer: BLUE CROSS/BLUE SHIELD | Admitting: Internal Medicine

## 2015-10-10 VITALS — BP 110/78 | HR 81 | Temp 98.2°F | Resp 12 | Ht 63.0 in | Wt 194.8 lb

## 2015-10-10 DIAGNOSIS — E038 Other specified hypothyroidism: Secondary | ICD-10-CM

## 2015-10-10 DIAGNOSIS — E785 Hyperlipidemia, unspecified: Secondary | ICD-10-CM

## 2015-10-10 DIAGNOSIS — E034 Atrophy of thyroid (acquired): Secondary | ICD-10-CM

## 2015-10-10 DIAGNOSIS — Z1239 Encounter for other screening for malignant neoplasm of breast: Secondary | ICD-10-CM

## 2015-10-10 DIAGNOSIS — E559 Vitamin D deficiency, unspecified: Secondary | ICD-10-CM

## 2015-10-10 DIAGNOSIS — N951 Menopausal and female climacteric states: Secondary | ICD-10-CM | POA: Diagnosis not present

## 2015-10-10 DIAGNOSIS — I10 Essential (primary) hypertension: Secondary | ICD-10-CM | POA: Diagnosis not present

## 2015-10-10 DIAGNOSIS — E669 Obesity, unspecified: Secondary | ICD-10-CM

## 2015-10-10 DIAGNOSIS — R232 Flushing: Secondary | ICD-10-CM

## 2015-10-10 DIAGNOSIS — Z9109 Other allergy status, other than to drugs and biological substances: Secondary | ICD-10-CM

## 2015-10-10 LAB — COMPREHENSIVE METABOLIC PANEL
ALK PHOS: 40 U/L (ref 39–117)
ALT: 19 U/L (ref 0–35)
AST: 19 U/L (ref 0–37)
Albumin: 4.4 g/dL (ref 3.5–5.2)
BILIRUBIN TOTAL: 0.4 mg/dL (ref 0.2–1.2)
BUN: 11 mg/dL (ref 6–23)
CO2: 29 meq/L (ref 19–32)
CREATININE: 0.64 mg/dL (ref 0.40–1.20)
Calcium: 9.9 mg/dL (ref 8.4–10.5)
Chloride: 103 mEq/L (ref 96–112)
GFR: 105.64 mL/min (ref >60.00)
GLUCOSE: 99 mg/dL (ref 70–99)
Potassium: 5 mEq/L (ref 3.5–5.1)
Sodium: 139 mEq/L (ref 135–145)
TOTAL PROTEIN: 7.5 g/dL (ref 6.0–8.3)

## 2015-10-10 LAB — LIPID PANEL
CHOL/HDL RATIO: 4
Cholesterol: 234 mg/dL — ABNORMAL HIGH (ref 0–200)
HDL: 57.5 mg/dL (ref >39.00)
NONHDL: 176.8
Triglycerides: 303 mg/dL — ABNORMAL HIGH (ref 0.0–149.0)
VLDL: 60.6 mg/dL — AB (ref 0.0–40.0)

## 2015-10-10 LAB — LUTEINIZING HORMONE: LH: 71.37 m[IU]/mL

## 2015-10-10 LAB — FOLLICLE STIMULATING HORMONE: FSH: 81.5 m[IU]/mL

## 2015-10-10 LAB — VITAMIN D 25 HYDROXY (VIT D DEFICIENCY, FRACTURES): VITD: 48.76 ng/mL (ref 30.00–100.00)

## 2015-10-10 LAB — LDL CHOLESTEROL, DIRECT: Direct LDL: 105 mg/dL

## 2015-10-10 NOTE — Patient Instructions (Signed)
The  diet I discussed with you today is the 10 day Green Smoothie Cleansing /Detox Diet by JJ Smith . available on [employer] for around $10.  It does require a blender, (Vita Mix, a electric juicer,  Or a Nutribullet Rx).  This is not a low carb or a weight loss diet,  It is fundamentally a "cleansing" low fat diet that eliminates sugar, gluten, caffeine, alcohol and dairy for 10 days .  What you add back after the initial ten days is entirely up to  you!  You can expect to lose 5 to 10 lbs depending on how strict you are.   I found that  drinking 2 smoothies or juices  daily and keeping one chewable meal (but keep it simple, like baked fish and salad, rice or bok choy) kept me satisfied and kept me from straying  .  You snack primarily on fresh  fruit, egg whites and judicious quantities of nuts.  You can add a  vegetable based protein powder  to any smoothie made with almond milk (nothing with whey , since whey is dairy)  [employer] has a few but  the [employer] has the greatest  selection .  Using frozen fruits is much more convenient and cost effective. You can even find plenty of organic fruit in the frozen fruit section of [employer].  Just thaw what you need for the following day the night before in the refrigerator (to avoid jamming up your machine)   The organic vegan protein powder I tried  is called Vega" and I found it at [employer] .  It is sugar free. Tastes like crap.  My advice:  Chew your protein  (eat an egg or two in the am with your smoothie or add soy yogurt for protein ) ,  Don't ruin the taste of your smoothies with protein powder unless you can find one you really love.  

## 2015-10-10 NOTE — Progress Notes (Signed)
Subjective:  Patient ID: Isabella Hester, female    DOB: Dec 29, 1970  Age: 45 y.o. MRN: [id number]  CC: The primary encounter diagnosis was Hot flashes. 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  HPI Isabella Hester presents for follow up on multiple issues, including hypothyroidism  She has been experiencing Hair loss,  Menometrorrhagia,  3 periods in Jan,  Not sleeping well,  daughter just started menstruating at the age of 20 . FH of earl y periods.  Was taking a lot of supplements  Prescribed by another physician until December . Noticed more lethargy in the afternoons. Not taking allegra or singulair bc mold issue resolved and stomach cramps    Sleep disrupted ,  Can't fall asleep ,  Last night felt like to   She is requesting a thermography scan in lieu of mammography given her hsitory fo rupture implant   Left shoulder pain with abduction     Outpatient Prescriptions Prior to Visit  Medication Sig Dispense Refill  . fexofenadine (ALLEGRA) 180 MG tablet Take 1 tablet (180 mg total) by mouth daily. 30 tablet 3  . ondansetron (ZOFRAN ODT) 8 MG disintegrating tablet Take 1 tablet (8 mg total) by mouth every 8 (eight) hours as needed for nausea or vomiting. 20 tablet 0  . zolmitriptan (ZOMIG) 5 MG nasal solution Place 1 spray into the nose as needed for migraine. 6 Units 5  . levothyroxine (SYNTHROID, LEVOTHROID) 75 MCG tablet TAKE 1 TABLET BY MOUTH EVERY MORNING BEFORE BREAKFAST 90 tablet 1  . Ascorbic Acid (VITAMIN C) 1000 MG tablet Take 1,000 mg by mouth daily. Reported on 10/10/2015    . Cholecalciferol ([employer]S VIT D 5000 HIGH-POTENCY PO) Take 1 capsule by mouth daily. Reported on 10/10/2015    . metoprolol tartrate (LOPRESSOR) 25 MG tablet Take 1 tablet  (25 mg total) by mouth at bedtime. (Patient not taking: Reported on 07/09/2015) 90 tablet 3  . montelukast (SINGULAIR) 10 MG tablet Take 1 tablet (10 mg total) by mouth at bedtime. (Patient not taking: Reported on 10/10/2015) 30 tablet 3  . nystatin-triamcinolone ointment (MYCOLOG) Apply 1 application topically 2 (two) times daily. (Patient not taking: Reported on 10/10/2015) 30 g 0   No facility-administered medications prior to visit.    Review of Systems;  Patient denies headache, fevers, malaise, unintentional weight loss, skin rash, eye pain, sinus congestion and sinus pain, sore throat, dysphagia,  hemoptysis , cough, dyspnea, wheezing, chest pain, palpitations, orthopnea, edema, abdominal pain, nausea, Hester, diarrhea, constipation, flank pain, dysuria, hematuria, urinary  Frequency, nocturia, numbness, tingling, seizures,  Focal weakness, Loss of consciousness,  Tremor, insomnia, depression, anxiety, and suicidal ideation.      Objective:  BP 110/78 mmHg  Pulse 81  Temp(Src) 98.2 F (36.8 C) (Oral)  Resp 12  Ht  (1.6 m)  Wt 194 lb 12 oz (88.338 kg)  BMI 34.51 kg/m2  SpO2 98%  LMP 08/18/2015  BP Readings from Last 3 Encounters:  10/10/15 110/78  07/09/15 122/84  07/12/14 110/78    Wt Readings from Last 3 Encounters:  10/10/15 194 lb 12 oz (88.338 kg)  07/09/15 194 lb 8 oz (88.225 kg)  07/12/14 194 lb 4 oz (88.111 kg)    General appearance: alert, cooperative and appears stated age Ears: normal TM's and external ear canals both ears Throat:  lips, mucosa, and tongue normal; teeth and gums normal Neck: no adenopathy, no carotid bruit, supple, symmetrical, trachea midline and thyroid not enlarged, symmetric, no tenderness/mass/nodules Back: symmetric, no curvature. ROM normal. No CVA tenderness. Lungs: clear to auscultation bilaterally Heart: regular rate and rhythm, S1, S2 normal, no murmur, click, rub or gallop Abdomen: soft, non-tender; bowel sounds normal; no  masses,  no organomegaly Pulses: 2+ and symmetric Skin: Skin color, texture, turgor normal. No rashes or lesions Lymph nodes: Cervical, supraclavicular, and axillary nodes normal.  Lab Results  Component Value Date   HGBA1C 5.4 03/27/2015    Lab Results  Component Value Date   CREATININE 0.64 10/10/2015   CREATININE 0.7 03/27/2015   CREATININE 0.8 03/30/2014    Lab Results  Component Value Date   WBC 7.0 03/27/2015   HGB 14.3 03/27/2015   HCT 42 03/27/2015   PLT 123* 03/27/2015   GLUCOSE 99 10/10/2015   CHOL 234* 10/10/2015   TRIG 303.0* 10/10/2015   HDL 57.50 10/10/2015   LDLDIRECT 105.0 10/10/2015   LDLCALC 134 03/27/2015   ALT 19 10/10/2015   AST 19 10/10/2015   NA 139 10/10/2015   K 5.0 10/10/2015   CL 103 10/10/2015   CREATININE 0.64 10/10/2015   BUN 11 10/10/2015   CO2 29 10/10/2015   TSH 0.112* 10/10/2015   HGBA1C 5.4 03/27/2015    No results found.  Assessment & Plan:   Problem List Items Addressed This Visit    Hypothyroidism    Thyroid function is overactive on s WNL on current 75 mcg dose.  She is symptomatic .  Ose reduced  To 50 mcg   Lab Results  Component Value Date   TSH 0.112* 10/10/2015           Relevant Medications   levothyroxine (SYNTHROID, LEVOTHROID) 50 MCG tablet   Obesity (BMI 30.0-34.9)    I have addressed  BMI and recommended a low glycemic index diet utilizing smaller more frequent meals to increase metabolism.  I have also recommended that patient start exercising with a goal of 30 minutes of aerobic exercise a minimum of 5 days per week. Screening for lipid disorders, thyroid and diabetes to be done today.        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 Searson's metoprolol tartrate, nystatin-triamcinolone ointment, and montelukast. I have also changed her levothyroxine. Additionally, I am having her maintain her Cholecalciferol ([employer]S VIT D 5000 HIGH-POTENCY PO), vitamin C, ondansetron, zolmitriptan, and fexofenadine.  Meds ordered this encounter  Medications  . levothyroxine (SYNTHROID, LEVOTHROID) 50 MCG tablet    Sig: Take 1 tablet (50 mcg total) by mouth daily before breakfast.    Dispense:  90 tablet  Refill:  1    Medications Discontinued During This Encounter  Medication Reason  . nystatin-triamcinolone ointment (MYCOLOG)   . montelukast (SINGULAIR) 10 MG tablet   . metoprolol tartrate (LOPRESSOR) 25 MG tablet   . levothyroxine (SYNTHROID, LEVOTHROID) 75 MCG tablet Reorder    Follow-up: No Follow-up on file.   Sherlene Shams, MD

## 2015-10-10 NOTE — Progress Notes (Signed)
Pre-visit discussion using our clinic review tool. No additional management support is needed unless otherwise documented below in the visit note.  

## 2015-10-11 LAB — T4 AND TSH
T4 TOTAL: 8.8 ug/dL (ref 4.5–12.0)
TSH: 0.112 u[IU]/mL — AB (ref 0.450–4.500)

## 2015-10-12 MED ORDER — LEVOTHYROXINE SODIUM 50 MCG PO TABS: 50.0000 ug | ORAL_TABLET | Freq: Every day | ORAL | Status: DC

## 2015-10-12 NOTE — Assessment & Plan Note (Signed)
She did not tolerate trial of Singulair due to severe abdominal pain

## 2015-10-12 NOTE — Assessment & Plan Note (Signed)
Thyroid function is overactive on s WNL on current 75 mcg dose.  She is symptomatic .  Ose reduced  To 50 mcg   Lab Results  Component Value Date   TSH 0.112* 10/10/2015

## 2015-10-12 NOTE — Assessment & Plan Note (Signed)
I have addressed  BMI and recommended a low glycemic index diet utilizing smaller more frequent meals to increase metabolism.  I have also recommended that patient start exercising with a goal of 30 minutes of aerobic exercise a minimum of 5 days per week. Screening for lipid disorders, thyroid and diabetes to be done today.   

## 2015-10-13 ENCOUNTER — Encounter: Payer: Self-pay | Admitting: Internal Medicine

## 2016-01-12 ENCOUNTER — Other Ambulatory Visit: Payer: Self-pay | Admitting: Internal Medicine

## 2016-01-14 NOTE — Telephone Encounter (Signed)
OK to refill Zomig last OV 3/17?

## 2016-01-15 NOTE — Telephone Encounter (Signed)
refilled 

## 2016-01-26 LAB — TSH: TSH: 3.5 u[IU]/mL (ref 0.41–5.90)

## 2016-02-20 ENCOUNTER — Other Ambulatory Visit: Payer: Self-pay | Admitting: Internal Medicine

## 2016-02-20 NOTE — Telephone Encounter (Signed)
Pt never came for 6 week follow up on lab work. Labs were done 10/10/15 and told to come back in 6 weeks to have TSH re-checked. Do we want to just give 30 day supply and have patient re-check TSH before getting a full 90 days? Pt has no follow up visit on file.

## 2016-02-20 NOTE — Telephone Encounter (Signed)
Lab Results  Component Value Date   TSH 0.112 (L) 10/10/2015   Yes 30 days refill  SENT ,  Needs lab appt

## 2016-02-21 ENCOUNTER — Encounter: Payer: Self-pay | Admitting: Internal Medicine

## 2016-02-21 NOTE — Telephone Encounter (Signed)
Notified patient. Pt will come Monday afternoon for blood work. Please place future orders. Thank you.

## 2016-02-22 ENCOUNTER — Telehealth: Payer: Self-pay | Admitting: Internal Medicine

## 2016-02-25 ENCOUNTER — Other Ambulatory Visit: Payer: BLUE CROSS/BLUE SHIELD

## 2016-03-10 NOTE — Telephone Encounter (Signed)
Mailed unread message to patient, thanks 

## 2016-12-18 ENCOUNTER — Ambulatory Visit (INDEPENDENT_AMBULATORY_CARE_PROVIDER_SITE_OTHER): Payer: 59 | Admitting: Family Medicine

## 2016-12-18 VITALS — BP 108/88 | HR 82 | Temp 98.2°F | Wt 202.5 lb

## 2016-12-18 DIAGNOSIS — R102 Pelvic and perineal pain: Secondary | ICD-10-CM

## 2016-12-18 DIAGNOSIS — R8290 Unspecified abnormal findings in urine: Secondary | ICD-10-CM | POA: Diagnosis not present

## 2016-12-18 LAB — POC URINALSYSI DIPSTICK (AUTOMATED)
Bilirubin, UA: NEGATIVE
Blood, UA: NEGATIVE
Glucose, UA: NEGATIVE
Ketones, UA: NEGATIVE
NITRITE UA: NEGATIVE
PH UA: 7.5 (ref 5.0–8.0)
PROTEIN UA: NEGATIVE
Spec Grav, UA: 1.015 (ref 1.010–1.025)
Urobilinogen, UA: 0.2 E.U./dL

## 2016-12-18 NOTE — Assessment & Plan Note (Signed)
New problem. Unclear etiology at this time. Has a remote history of endometriosis. Possible cystocele. Trace leukocytes on urinalysis. I advised the patient that she should have a pelvic exam. She is in agreement to follow-up with her primary care physician regarding this. Sending urine for culture.

## 2016-12-18 NOTE — Progress Notes (Signed)
Subjective:  Patient ID: Isabella Hester, female    DOB: [birth year]  Age: 46 y.o. MRN: [id number]  CC: Abdominal pain, fever/chills  HPI:  46 year old female presents with the above complaints.  Patient reports a 2-3 week history of lower abdominal pain/suprapubic pressure. She reports subjective fever and chills. No documented fever at home. She reports that she had a brief episode of dysuria early on but this is resolved. No reports of urinary frequency or urgency. Patient feels like there is a lot of pressure in her lower abdomen/pelvis. Patient states that sometimes she feels like something is going to "fall out". No reports of vaginal discharge. She does not noted anything protruding from the vagina. No known exacerbating or relieving factors. No other associated symptoms. No other complaints or concerns at this time.  Social Hx   Social History   Social History  . Marital status: Legally Separated    Spouse name: N/A  . Number of children: N/A  . Years of education: N/A   Social History Main Topics  . Smoking status: Never Smoker  . Smokeless tobacco: Not on file  . Alcohol use 0.6 oz/week    1 Glasses of wine per week  . Drug use: No  . Sexual activity: Not on file   Other Topics Concern  . Not on file   Social History Narrative  . No narrative on file    Review of Systems  Gastrointestinal: Positive for abdominal pain.  Genitourinary: Negative for frequency and urgency.       Dysuria resolved.   Objective:  BP 108/88 (BP Location: Left Arm, Patient Position: Sitting, Cuff Size: Large)   Pulse 82   Temp 98.2 F (36.8 C) (Oral)   Wt 202 lb 8 oz (91.9 kg)   SpO2 96%   BMI 35.87 kg/m   BP/Weight 12/18/2016 10/10/2015 07/09/2015  Systolic BP 108 110 122  Diastolic BP 88 78 84  Wt. (Lbs) 202.5 194.75 194.5  BMI 35.87 34.51 34.46    Physical Exam  Constitutional: She is oriented to person, place, and time. She appears well-developed. No distress.    Cardiovascular: Normal rate and regular rhythm.   Pulmonary/Chest: Effort normal and breath sounds normal.  Abdominal:  Suprapubic tenderness to palpation. No rebound or guarding. Nondistended.  Neurological: She is alert and oriented to person, place, and time.  Psychiatric: She has a normal mood and affect.  Vitals reviewed.   Lab Results  Component Value Date   WBC 7.0 03/27/2015   HGB 14.3 03/27/2015   HCT 42 03/27/2015   PLT 123 (A) 03/27/2015   GLUCOSE 99 10/10/2015   CHOL 234 (H) 10/10/2015   TRIG 303.0 (H) 10/10/2015   HDL 57.50 10/10/2015   LDLDIRECT 105.0 10/10/2015   LDLCALC 134 03/27/2015   ALT 19 10/10/2015   AST 19 10/10/2015   NA 139 10/10/2015   K 5.0 10/10/2015   CL 103 10/10/2015   CREATININE 0.64 10/10/2015   BUN 11 10/10/2015   CO2 29 10/10/2015   TSH 3.50 01/26/2016   HGBA1C 5.4 03/27/2015   Results for orders placed or performed in visit on 12/18/16 (from the past 24 hour(s))  POCT Urinalysis Dipstick (Automated)     Status: Abnormal   Collection Time: 12/18/16 10:01 AM  Result Value Ref Range   Color, UA yellow    Clarity, UA clear    Glucose, UA negative    Bilirubin, UA negative    Ketones, UA negative    Spec  Grav, UA 1.015 1.010 - 1.025   Blood, UA negative    pH, UA 7.5 5.0 - 8.0   Protein, UA negative    Urobilinogen, UA 0.2 0.2 or 1.0 E.U./dL   Nitrite, UA negative    Leukocytes, UA Trace (A) Negative   Assessment & Plan:   Problem List Items Addressed This Visit      Other   Suprapubic pressure - Primary    New problem. Unclear etiology at this time. Has a remote history of endometriosis. Possible cystocele. Trace leukocytes on urinalysis. I advised the patient that she should have a pelvic exam. She is in agreement to follow-up with her primary care physician regarding this. Sending urine for culture.      Relevant Orders   POCT Urinalysis Dipstick (Automated) (Completed)   Urine Culture     Follow-up: PRN  Everlene OtherJayce  Charleene Callegari DO [facility] [place]

## 2016-12-18 NOTE — Patient Instructions (Signed)
Schedule an appt with Dr. Darrick Huntsmanullo for pelvic.  We will call with the culture results.  Take care  Dr. Adriana Simasook

## 2016-12-19 LAB — URINE CULTURE

## 2016-12-29 ENCOUNTER — Ambulatory Visit (INDEPENDENT_AMBULATORY_CARE_PROVIDER_SITE_OTHER)
Admit: 2016-12-29 | Discharge: 2016-12-29 | Disposition: A | Discharge status: Home / Self Care | Payer: 59 | Attending: Internal Medicine | Admitting: Internal Medicine

## 2016-12-29 ENCOUNTER — Ambulatory Visit
Admission: EM | Admit: 2016-12-29 | Discharge: 2016-12-29 | Disposition: A | Discharge status: Home / Self Care | Payer: 59 | Attending: Family Medicine | Admitting: Family Medicine

## 2016-12-29 ENCOUNTER — Encounter: Payer: Self-pay | Admitting: *Deleted

## 2016-12-29 DIAGNOSIS — R102 Pelvic and perineal pain: Secondary | ICD-10-CM

## 2016-12-29 DIAGNOSIS — M545 Low back pain: Secondary | ICD-10-CM | POA: Diagnosis not present

## 2016-12-29 DIAGNOSIS — R1030 Lower abdominal pain, unspecified: Secondary | ICD-10-CM | POA: Diagnosis not present

## 2016-12-29 LAB — BASIC METABOLIC PANEL
Anion gap: 10 (ref 5–15)
BUN: 9 mg/dL (ref 6–20)
CALCIUM: 9.1 mg/dL (ref 8.9–10.3)
CO2: 27 mmol/L (ref 22–32)
CREATININE: 0.81 mg/dL (ref 0.44–1.00)
Chloride: 102 mmol/L (ref 101–111)
GFR calc Af Amer: >60 mL/min (ref >60)
GFR calc non Af Amer: >60 mL/min (ref >60)
GLUCOSE: 111 mg/dL — AB (ref 65–99)
Potassium: 3.8 mmol/L (ref 3.5–5.1)
Sodium: 139 mmol/L (ref 135–145)

## 2016-12-29 LAB — CBC WITH DIFFERENTIAL/PLATELET
BASOS PCT: 1 %
Basophils Absolute: 0.1 10*3/uL (ref 0–0.1)
Eosinophils Absolute: 0.1 10*3/uL (ref 0–0.7)
Eosinophils Relative: 1 %
HEMATOCRIT: 44.9 % (ref 35.0–47.0)
Hemoglobin: 14.9 g/dL (ref 12.0–16.0)
LYMPHS PCT: 27 %
Lymphs Abs: 3.5 10*3/uL (ref 1.0–3.6)
MCH: 31.2 pg (ref 26.0–34.0)
MCHC: 33.3 g/dL (ref 32.0–36.0)
MCV: 93.8 fL (ref 80.0–100.0)
MONO ABS: 0.8 10*3/uL (ref 0.2–0.9)
MONOS PCT: 7 %
NEUTROS ABS: 8.3 10*3/uL — AB (ref 1.4–6.5)
Neutrophils Relative %: 64 %
Platelets: 154 10*3/uL (ref 150–440)
RBC: 4.79 MIL/uL (ref 3.80–5.20)
RDW: 13.4 % (ref 11.5–14.5)
WBC: 12.9 10*3/uL — ABNORMAL HIGH (ref 3.6–11.0)

## 2016-12-29 LAB — URINALYSIS, COMPLETE (UACMP) WITH MICROSCOPIC
BACTERIA UA: NONE SEEN
Bilirubin Urine: NEGATIVE
Glucose, UA: NEGATIVE mg/dL
Hgb urine dipstick: NEGATIVE
Ketones, ur: NEGATIVE mg/dL
Leukocytes, UA: NEGATIVE
Nitrite: NEGATIVE
PROTEIN: NEGATIVE mg/dL
Specific Gravity, Urine: 1.015 (ref 1.005–1.030)
pH: 7 (ref 5.0–8.0)

## 2016-12-29 LAB — WET PREP, GENITAL
Clue Cells Wet Prep HPF POC: NONE SEEN
Sperm: NONE SEEN
Trich, Wet Prep: NONE SEEN
Yeast Wet Prep HPF POC: NONE SEEN

## 2016-12-29 LAB — PREGNANCY, URINE: Preg Test, Ur: NEGATIVE

## 2016-12-29 MED ORDER — OXYCODONE-ACETAMINOPHEN 5-325 MG PO TABS: 1.0000 | ORAL_TABLET | Freq: Three times a day (TID) | ORAL | 0 refills | Status: DC | PRN

## 2016-12-29 NOTE — ED Triage Notes (Signed)
Patient started having pelvic and back pain 2 weeks ago. PCP visit revealed high WBC and diagnosed with possible dropped bladder. Pain resolved and returned 2 days ago with Pelvic and back pain.

## 2016-12-29 NOTE — Discharge Instructions (Signed)
Take medication as prescribed. Rest. Drink plenty of fluids.   Follow up with your primary care physician tomorrow. Call today. Return to Urgent care or Emergency room for fever, increased pain, new or worsening concerns.

## 2016-12-29 NOTE — ED Provider Notes (Signed)
[facility] ____________________________________________  Time seen: Approximately 10:15 AM  I have reviewed the triage vital signs and the nursing notes.   HISTORY  Chief Complaint Pelvic Pain and Back Pain   HPI Isabella Hester is a 46 y.o. female present for evaluation of suprapubic pain that has been present for the last 3 weeks. Patient reports that she did have similar pain that was present briefly 6 months ago. Patient reports in the last 3 weeks pain has been consistent, 1 week the pain spontaneously resolved and then returned 2 days ago. Denies doing anything to be pain resolved. Patient reports his pain returned pain has been moderate level. Denies alleviating factors. Reports pain has been noticed to be worse with active bowel movement and some position, but constant at rest as well. Denies any injury or fall. Patient describes current complaint as suprapubic pressure pain , with some accompanying a low back aching pain. Denies fall. States she's been having some intermittent chills without fevers. States has been taking over-the-counter cranberry pills thinking it could be urinary related. Patient reports that she does sometimes doesn't feel like she empties her bladder completely, but denies urinary frequency, urinary urgency or urinary burning. Occasional vaginal discharge, denies atypical vaginal discharge. States not currently sexually active and not been sexually active for quite some time, declines any concerns of STDs.  Patient states that she was seen 11 days ago at her primary doctor's office for the same complaint. At that time her urinalysis is unremarkable and was recommended to have a pelvic exam, pelvic was not completed. Patient states that she has an appointment with OB/GYN tomorrow for the same complaint and same evaluation. Denies history of endometriosis or fibroids. Denies abnormal color stools, melena, hematochezia, frank blood. Denies hematuria. Reports  occasional constipation, states she is not straining with every bowel movement. Occasional nausea, no vomiting.  Denies chest pain, shortness of breath, abdominal pain,, extremity pain, extremity swelling or rash. Denies recent sickness. Denies recent antibiotic use.   PCP: Darrick Huntsman   Past Medical History:  Diagnosis Date  . Arthritis   . Asthma   . Chronic headaches   . Heart murmur   . Migraines   . Thyroid disease     Patient Active Problem List   Diagnosis Date Noted  . Suprapubic pressure 12/18/2016  . Migraine without aura and without status migrainosus, not intractable 07/15/2014  . S/P silicone breast implant 07/15/2014  . 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 Thrombocytopenia (HCC) 05/25/2012  . Vitamin D deficiency 05/24/2012  . Hypothyroidism 05/24/2012  . Obesity (BMI 30.0-34.9) 05/24/2012    Past Surgical History:  Procedure Laterality Date  . BREAST ENHANCEMENT SURGERY     saline     No current facility-administered medications for this encounter.   Current Outpatient Prescriptions:  .  levothyroxine (SYNTHROID, LEVOTHROID) 50 MCG tablet, Take 1 tablet (50 mcg total) by mouth daily before breakfast. Refill for 30 days only.  LAB VISIT NEEDED prior to any more refills, Disp: 30 tablet, Rfl: 0 .  Magnesium Malate 1250 (141.7 Mg) MG TABS, Take 1 tablet by mouth daily., Disp: , Rfl:  .  ZOMIG 5 MG nasal solution, USE 1 SPRAY IN THE NOSE AS NEEDED FOR MIGRAINE, Disp: 6 each, Rfl: 1 .  Ascorbic Acid (VITAMIN C) 1000 MG tablet, Take 1,000 mg by mouth daily. Reported on 10/10/2015, Disp: , Rfl:  .  Cholecalciferol ([employer] VIT D 5000 HIGH-POTENCY PO),  Take 1 capsule by mouth daily. Reported on 10/10/2015, Disp: , Rfl:  .  fexofenadine (ALLEGRA) 180 MG tablet, Take 1 tablet (180 mg total) by mouth daily., Disp: 30 tablet, Rfl: 3 .  ondansetron (ZOFRAN ODT) 8 MG disintegrating tablet, Take 1 tablet (8 mg total) by mouth every  8 (eight) hours as needed for nausea or vomiting. (Patient not taking: Reported on 12/18/2016), Disp: 20 tablet, Rfl: 0 .  oxyCODONE-acetaminophen (ROXICET) 5-325 MG tablet, Take 1 tablet by mouth every 8 (eight) hours as needed for moderate pain or severe pain (Do not drive or operate heavy machinery while taking as can cause drowsiness.)., Disp: 9 tablet, Rfl: 0  Allergies Arthro-complex [nutritional supplements]; Ceclor [cefaclor]; Penicillins; and Erythromycin  Family History  Problem Relation Age of Onset  . Cancer Father        Lung  . Heart disease Father   . Mental illness Maternal Grandmother        dementia  . Cancer Paternal Grandmother [id number]       colon Ca  . Cancer Paternal Grandfather        bone mets unnown primary    Social History Social History  Substance Use Topics  . Smoking status: Never Smoker  . Smokeless tobacco: Never Used  . Alcohol use 0.6 oz/week    1 Glasses of wine per week    Review of Systems Constitutional: No fever/chills ENT: No sore throat. Cardiovascular: Denies chest pain. Respiratory: Denies shortness of breath. Gastrointestinal: As above.  Genitourinary: AS above.  Musculoskeletal: Negative for back pain. Skin: Negative for rash. Neurological: Negative for headaches, focal weakness or numbness.  ____________________________________________   PHYSICAL EXAM:  VITAL SIGNS: ED Triage Vitals  Enc Vitals Group     BP 12/29/16 0950 (!) 135/95     Pulse Rate 12/29/16 0950 83     Resp 12/29/16 0950 16     Temp 12/29/16 0950 98.4 F (36.9 C)     Temp Source 12/29/16 0950 Oral     SpO2 12/29/16 0950 97 %     Weight 12/29/16 0951 195 lb (88.5 kg)     Height 12/29/16 0951 5\' 4"  (1.626 m)     Head Circumference --      Peak Flow --      Pain Score 12/29/16 0952 6     Pain Loc --      Pain Edu? --      Excl. in GC? --     Constitutional: Alert and oriented. Well appearing and in no acute distress. ENT      Head: Normocephalic and  atraumatic.      Mouth/Throat: Mucous membranes are moist. Cardiovascular: Normal rate, regular rhythm. Grossly normal heart sounds.  Good peripheral circulation. Respiratory: Normal respiratory effort without tachypnea nor retractions. Breath sounds are clear and equal bilaterally. No wheezes, rales, rhonchi. Gastrointestinal: Soft. Mild to moderate suprapubic tenderness to palpation and left suprapubic tenderness. Abdomen otherwise soft and nontender. Normal Bowel sounds. No CVA tenderness. Pelvic: Exam completed with Jacki ConesLaurie RN at bedside a chaperone. External: Normal appearance, no rash, no lesions, no bulge. Speculum: Mild whitish vaginal discharge, no bleeding, cervical os closed, no foreign bodies. Bimanual: Moderate left adnexal tenderness, no cervical motion tenderness, no right adnexal tenderness. Musculoskeletal:  Ambulatory with steady gait. No midline cervical, thoracic or lumbar tenderness to palpation.  Neurologic:  Normal speech and language. Speech is normal. No gait instability.  Skin:  Skin is warm, dry. Psychiatric: Mood and affect are  normal. Speech and behavior are normal. Patient exhibits appropriate insight and judgment   ___________________________________________   LABS (all labs ordered are listed, but only abnormal results are displayed)  Labs Reviewed  WET PREP, GENITAL - Abnormal; Notable for the following:       Result Value   WBC, Wet Prep HPF POC FEW (*)    All other components within normal limits  URINALYSIS, COMPLETE (UACMP) WITH MICROSCOPIC - Abnormal; Notable for the following:    Squamous Epithelial / LPF 0-5 (*)    All other components within normal limits  CBC WITH DIFFERENTIAL/PLATELET - Abnormal; Notable for the following:    WBC 12.9 (*)    Neutro Abs 8.3 (*)    All other components within normal limits  BASIC METABOLIC PANEL - Abnormal; Notable for the following:    Glucose, Bld 111 (*)    All other components within normal limits    PREGNANCY, URINE    RADIOLOGY  US Transvaginal Non-ob  Result Date: 12/29/2016 CLINICAL DATA:  Patient with suprapubic and left adnexal pain for 2 days. EXAM: TRANSABDOMINAL AND TRANSVAGINAL ULTRASOUND OF PELVIS DOPPLER ULTRASOUND OF OVARIES TECHNIQUE: Both transabdominal and transvaginal ultrasound examinations of the pelvis were performed. Transabdominal technique was performed for global imaging of the pelvis including uterus, ovaries, adnexal regions, and pelvic cul-de-sac. It was necessary to proceed with endovaginal exam following the transabdominal exam to visualize the endometrium and adnexal structures. Color and duplex Doppler ultrasound was utilized to evaluate blood flow to the ovaries. COMPARISON:  None. FINDINGS: Uterus Measurements: 6.3 x 3.5 x 3.9 cm. No fibroids or other mass visualized. Endometrium Thickness: 3 mm.  No focal abnormality visualized. Right ovary Measurements: 1.0 x 0.9 x 1.5 cm. Normal appearance/no adnexal mass. Left ovary Measurements: 1.6 x 1.0 x 0.9 cm. Normal appearance/no adnexal mass. Pulsed Doppler evaluation of both ovaries demonstrates normal low-resistance arterial and venous waveforms. Other findings No abnormal free fluid. IMPRESSION: No acute process. Normal sonographic appearance of the ovaries. Arterial and venous waveforms demonstrated within the left and right ovaries. Electronically Signed   By: Annia Belt M.D.   On: 12/29/2016 13:22   US Pelvis Complete  Result Date: 12/29/2016 CLINICAL DATA:  Patient with suprapubic and left adnexal pain for 2 days. EXAM: TRANSABDOMINAL AND TRANSVAGINAL ULTRASOUND OF PELVIS DOPPLER ULTRASOUND OF OVARIES TECHNIQUE: Both transabdominal and transvaginal ultrasound examinations of the pelvis were performed. Transabdominal technique was performed for global imaging of the pelvis including uterus, ovaries, adnexal regions, and pelvic cul-de-sac. It was necessary to proceed with endovaginal exam following the  transabdominal exam to visualize the endometrium and adnexal structures. Color and duplex Doppler ultrasound was utilized to evaluate blood flow to the ovaries. COMPARISON:  None. FINDINGS: Uterus Measurements: 6.3 x 3.5 x 3.9 cm. No fibroids or other mass visualized. Endometrium Thickness: 3 mm.  No focal abnormality visualized. Right ovary Measurements: 1.0 x 0.9 x 1.5 cm. Normal appearance/no adnexal mass. Left ovary Measurements: 1.6 x 1.0 x 0.9 cm. Normal appearance/no adnexal mass. Pulsed Doppler evaluation of both ovaries demonstrates normal low-resistance arterial and venous waveforms. Other findings No abnormal free fluid. IMPRESSION: No acute process. Normal sonographic appearance of the ovaries. Arterial and venous waveforms demonstrated within the left and right ovaries. Electronically Signed   By: Annia Belt M.D.   On: 12/29/2016 13:22   [place] Art/ven Flow Abd Pelv Doppler  Result Date: 12/29/2016 CLINICAL DATA:  Patient with suprapubic and left adnexal pain for 2 days. EXAM: TRANSABDOMINAL AND TRANSVAGINAL  ULTRASOUND OF PELVIS DOPPLER ULTRASOUND OF OVARIES TECHNIQUE: Both transabdominal and transvaginal ultrasound examinations of the pelvis were performed. Transabdominal technique was performed for global imaging of the pelvis including uterus, ovaries, adnexal regions, and pelvic cul-de-sac. It was necessary to proceed with endovaginal exam following the transabdominal exam to visualize the endometrium and adnexal structures. Color and duplex Doppler ultrasound was utilized to evaluate blood flow to the ovaries. COMPARISON:  None. FINDINGS: Uterus Measurements: 6.3 x 3.5 x 3.9 cm. No fibroids or other mass visualized. Endometrium Thickness: 3 mm.  No focal abnormality visualized. Right ovary Measurements: 1.0 x 0.9 x 1.5 cm. Normal appearance/no adnexal mass. Left ovary Measurements: 1.6 x 1.0 x 0.9 cm. Normal appearance/no adnexal mass. Pulsed Doppler evaluation of both ovaries demonstrates normal  low-resistance arterial and venous waveforms. Other findings No abnormal free fluid. IMPRESSION: No acute process. Normal sonographic appearance of the ovaries. Arterial and venous waveforms demonstrated within the left and right ovaries. Electronically Signed   By: Annia Belt M.D.   On: 12/29/2016 13:22   ____________________________________________   PROCEDURES Procedures   __________________________________________   INITIAL IMPRESSION / ASSESSMENT AND PLAN / ED COURSE  Pertinent labs & imaging results that were available during my care of the patient were reviewed by me and considered in my medical decision making (see chart for details).  Well-appearing patient. No acute distress. Urinalysis unremarkable. Will evaluate CBC, BMP and recommend pelvic exam and pelvic ultrasound. Patient agrees to this.  Laboratory results discussed in detail with patient. Ultrasound results reviewed and discussed patient's, per radiologist pelvic ultrasound no acute process, normal appearance of ovaries and arterial and venous waveforms initiated within the left and right ovaries. Discussed these results in detail with patient. As pain continues with negative pelvic ultrasound, encourage patient to continue with appointment with OB/GYN scheduled for tomorrow as well as discussed patient to follow-up closely with primary care physician and possibel CT evaluation for further evaluation of the association of pain complaints. No CT availability at this facility at this time. Encouraged supportive care. Discussed patient Rx given for Percocet tablets as needed for breakthrough pain. Discussed strict follow-up and return parameters even to going to ER for increased pain. Patient states that she will follow-up with primary care tomorrow as well.Discussed indication, risks and benefits of medications with patient.   [place] controlled substance database reviewed, and no recent controlled medications  documented.  Discussed follow up with Primary care physician this week. Discussed follow up and return parameters including no resolution or any worsening concerns. Patient verbalized understanding and agreed to plan.   ____________________________________________   FINAL CLINICAL IMPRESSION(S) / ED DIAGNOSES  Final diagnoses:  Suprapubic pain     Discharge Medication List as of 12/29/2016  1:34 PM    START taking these medications   Details  oxyCODONE-acetaminophen (ROXICET) 5-325 MG tablet Take 1 tablet by mouth every 8 (eight) hours as needed for moderate pain or severe pain (Do not drive or operate heavy machinery while taking as can cause drowsiness.)., Starting Mon 12/29/2016, Print        Note: This dictation was prepared with Dragon dictation along with smaller phrase technology. Any transcriptional errors that result from this process are unintentional.         Renford Dills, NP 12/29/16 2137

## 2017-02-16 ENCOUNTER — Ambulatory Visit
Admission: EM | Admit: 2017-02-16 | Discharge: 2017-02-16 | Disposition: A | Discharge status: Home / Self Care | Payer: Managed Care, Other (non HMO) | Attending: Family Medicine | Admitting: Family Medicine

## 2017-02-16 DIAGNOSIS — R05 Cough: Secondary | ICD-10-CM | POA: Diagnosis not present

## 2017-02-16 DIAGNOSIS — J0100 Acute maxillary sinusitis, unspecified: Secondary | ICD-10-CM

## 2017-02-16 DIAGNOSIS — R059 Cough, unspecified: Secondary | ICD-10-CM

## 2017-02-16 MED ORDER — BENZONATATE 100 MG PO CAPS: 100.0000 mg | ORAL_CAPSULE | Freq: Three times a day (TID) | ORAL | 0 refills | Status: DC | PRN

## 2017-02-16 MED ORDER — ALBUTEROL SULFATE HFA 108 (90 BASE) MCG/ACT IN AERS: 2.0000 | INHALATION_SPRAY | RESPIRATORY_TRACT | 0 refills | Status: DC | PRN

## 2017-02-16 MED ORDER — HYDROCOD POLST-CPM POLST ER 10-8 MG/5ML PO SUER: 5.0000 mL | Freq: Every evening | ORAL | 0 refills | Status: DC | PRN

## 2017-02-16 MED ORDER — DOXYCYCLINE HYCLATE 100 MG PO CAPS: 100.0000 mg | ORAL_CAPSULE | Freq: Two times a day (BID) | ORAL | 0 refills | Status: DC

## 2017-02-16 NOTE — ED Triage Notes (Signed)
46 year old Caucasian female is here with complaints of cough - dry; sneezing; nasal - yellow; headache; sinus pressure and a little wheezing that started last week. She states she has only been taking supplements with no relief.

## 2017-02-16 NOTE — Discharge Instructions (Signed)
Take medication as prescribed. Rest. Drink plenty of fluids.  ° °Follow up with your primary care physician this week as needed. Return to Urgent care for new or worsening concerns.  ° °

## 2017-02-16 NOTE — ED Provider Notes (Signed)
[facility] ____________________________________________  Time seen: Approximately 1:52 PM  I have reviewed the triage vital signs and the nursing notes.   HISTORY  Chief Complaint Cough and Nasal Congestion   HPI Isabella Hester is a 46 y.o. female  presenting for evaluation of 1 week of runny nose, nasal congestion, sinus pressure, postnasal drainage and cough. Reports occasionally some wheezing with her associated cough. Reports cough is mostly a dry nonproductive cough. Reports she had been getting a lot of thick greenish mucus when blowing her nose out, but reports the last 2 days sinuses now feel clogged with increased pressure. Reports has been trying over-the-counter herbal supplements without improvement. Denies and fevers. States initially had some sore throat, reports sore throat has resolved as well as the season she was having has resolved. Reports recent coworker with strep throat as well as upper respiratory infection. States that she is planning to fly out of town tomorrow and wanted to be evaluated. Denies associated chest pain, shortness of breath or chest pain with deep breath. Reports continues to remain active. Reports continues to eat and drink well. Denies aggravating or alleviating factors.  Denies chest pain, shortness of breath, hemoptysis, current abdominal pain, dysuria, extremity pain, extremity swelling or rash. Denies recent sickness. Denies recent antibiotic use. Denies cardiac history. Denies renal insufficiency.Denies pregnancy.   Sherlene Shams, MD: PCP    Past Medical History:  Diagnosis Date  . Arthritis   . Asthma   . Chronic headaches   . Heart murmur   . Migraines   . Thyroid disease     Patient Active Problem List   Diagnosis Date Noted  . Suprapubic pressure 12/18/2016  . Migraine without aura and without status migrainosus, not intractable 07/15/2014  . S/P silicone breast implant 07/15/2014  . Silicone leakage from breast  implant 07/15/2014  . 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 Thrombocytopenia (HCC) 05/25/2012  . Vitamin D deficiency 05/24/2012  . Hypothyroidism 05/24/2012  . Obesity (BMI 30.0-34.9) 05/24/2012    Past Surgical History:  Procedure Laterality Date  . BREAST ENHANCEMENT SURGERY     saline     No current facility-administered medications for this encounter.   Current Outpatient Prescriptions:  .  Ascorbic Acid (VITAMIN C) 1000 MG tablet, Take 1,000 mg by mouth daily. Reported on 10/10/2015, Disp: , Rfl:  .  Cholecalciferol ([employer] VIT D 5000 HIGH-POTENCY PO), Take 1 capsule by mouth daily. Reported on 10/10/2015, Disp: , Rfl:  .  fexofenadine (ALLEGRA) 180 MG tablet, Take 1 tablet (180 mg total) by mouth daily., Disp: 30 tablet, Rfl: 3 .  levothyroxine (SYNTHROID, LEVOTHROID) 50 MCG tablet, Take 1 tablet (50 mcg total) by mouth daily before breakfast. Refill for 30 days only.  LAB VISIT NEEDED prior to any more refills, Disp: 30 tablet, Rfl: 0 .  Magnesium Malate 1250 (141.7 Mg) MG TABS, Take 1 tablet by mouth daily., Disp: , Rfl:  .  ZOMIG 5 MG nasal solution, USE 1 SPRAY IN THE NOSE AS NEEDED FOR MIGRAINE, Disp: 6 each, Rfl: 1 .  albuterol (PROVENTIL HFA;VENTOLIN HFA) 108 (90 Base) MCG/ACT inhaler, Inhale 2 puffs into the lungs every 4 (four) hours as needed for wheezing., Disp: 1 Inhaler, Rfl: 0 .  benzonatate (TESSALON PERLES) 100 MG capsule, Take 1 capsule (100 mg total) by mouth 3 (three) times daily as needed for cough., Disp: 15 capsule, Rfl: 0 .  chlorpheniramine-HYDROcodone (TUSSIONEX PENNKINETIC ER) 10-8 MG/5ML SUER, Take 5 mLs by mouth at bedtime  as needed. do not drive or operate machinery while taking as can cause drowsiness., Disp: 75 mL, Rfl: 0 .  doxycycline (VIBRAMYCIN) 100 MG capsule, Take 1 capsule (100 mg total) by mouth 2 (two) times daily., Disp: 20 capsule, Rfl: 0  Allergies Arthro-complex [nutritional supplements]; Ceclor [cefaclor]; Penicillins; and  Erythromycin  Family History  Problem Relation Age of Onset  . Cancer Father        Lung  . Heart disease Father   . Mental illness Maternal Grandmother        dementia  . Cancer Paternal Grandmother [id number]       colon Ca  . Cancer Paternal Grandfather        bone mets unnown primary    Social History Social History  Substance Use Topics  . Smoking status: Never Smoker  . Smokeless tobacco: Never Used  . Alcohol use 0.6 oz/week    1 Glasses of wine per week    Review of Systems Constitutional: No fever/chills Eyes: No visual changes. ENT: No current sore throat. Cardiovascular: Denies chest pain. Respiratory: Denies shortness of breath. Gastrointestinal: No abdominal pain.  No nausea, no vomiting.  No diarrhea.  Genitourinary: Negative for dysuria. Musculoskeletal: Negative for back pain. Skin: Negative for rash.  ____________________________________________   PHYSICAL EXAM:  VITAL SIGNS: ED Triage Vitals  Enc Vitals Group     BP 02/16/17 1309 114/82     Pulse Rate 02/16/17 1309 85     Resp 02/16/17 1309 16     Temp 02/16/17 1309 98.1 F (36.7 C)     Temp Source 02/16/17 1309 Oral     SpO2 02/16/17 1309 98 %     Weight 02/16/17 1312 195 lb (88.5 kg)     Height 02/16/17 1312 5\' 4"  (1.626 m)     Head Circumference --      Peak Flow --      Pain Score 02/16/17 1312 5     Pain Loc --      Pain Edu? --      Excl. in GC? --    Constitutional: Alert and oriented. Well appearing and in no acute distress. Eyes: Conjunctivae are normal. Head: Atraumatic.Mild to moderate tenderness to palpation bilateral frontal and maxillary sinuses, more tender maxillary sinuses. No swelling. No erythema.   Ears: no erythema, normal TMs bilaterally.   Nose: nasal congestion with bilateral nasal turbinate erythema and edema.   Mouth/Throat: Mucous membranes are moist.  Oropharynx non-erythematous.No tonsillar swelling or exudate. Posterior pharyngeal drainage present. Neck: No  stridor.  No cervical spine tenderness to palpation. Hematological/Lymphatic/Immunilogical: No cervical lymphadenopathy. Cardiovascular: Normal rate, regular rhythm. Grossly normal heart sounds.  Good peripheral circulation. Respiratory: Normal respiratory effort.  No retractions.  No wheezes, rales or rhonchi. Good air movement. Occasional dry cough noted with bronchospasm. Speaks in complete sentences. Musculoskeletal:No cervical, thoracic or lumbar tenderness to palpation.  Neurologic:  Normal speech and language. No gait instability. Skin:  Skin is warm, dr Psychiatric: Mood and affect are normal. Speech and behavior are normal.  ___________________________________________   LABS (all labs ordered are listed, but only abnormal results are displayed)  Labs Reviewed - No data to display ____________________________________________  PROCEDURES Procedures    INITIAL IMPRESSION / ASSESSMENT AND PLAN / ED COURSE  Pertinent labs & imaging results that were available during my care of the patient were reviewed by me and considered in my medical decision making (see chart for details).   well-appearing patient. No acute distress. Discussed  in detail with patient suspect viral upper respiratory infection with cough, discuss concern for secondary bacterial sinusitis. Will treat patient with oral doxycycline, when necessary Tessalon Perles and when necessary Tussionex as well as when necessary albuterol inhaler. Encouraged rest, fluids and supportive care. Discussed strict follow-up and return parameters. Discussed indication, risks and benefits of medications with patient.  Discussed follow up with Primary care physician this week. Discussed follow up and return parameters including no resolution or any worsening concerns. Patient verbalized understanding and agreed to plan.   ____________________________________________   FINAL CLINICAL IMPRESSION(S) / ED DIAGNOSES  Final diagnoses:   Acute maxillary sinusitis, recurrence not specified  Cough     Discharge Medication List as of 02/16/2017  1:37 PM    START taking these medications   Details  albuterol (PROVENTIL HFA;VENTOLIN HFA) 108 (90 Base) MCG/ACT inhaler Inhale 2 puffs into the lungs every 4 (four) hours as needed for wheezing., Starting Mon 02/16/2017, Normal    benzonatate (TESSALON PERLES) 100 MG capsule Take 1 capsule (100 mg total) by mouth 3 (three) times daily as needed for cough., Starting Mon 02/16/2017, Normal    chlorpheniramine-HYDROcodone (TUSSIONEX PENNKINETIC ER) 10-8 MG/5ML SUER Take 5 mLs by mouth at bedtime as needed. do not drive or operate machinery while taking as can cause drowsiness., Starting Mon 02/16/2017, Print    doxycycline (VIBRAMYCIN) 100 MG capsule Take 1 capsule (100 mg total) by mouth 2 (two) times daily., Starting Mon 02/16/2017, Normal        Note: This dictation was prepared with Dragon dictation along with smaller phrase technology. Any transcriptional errors that result from this process are unintentional.         Renford DillsMiller, Twylla Arceneaux, NP 02/16/17 1357

## 2017-02-26 ENCOUNTER — Telehealth: Payer: Self-pay | Admitting: Internal Medicine

## 2017-02-26 NOTE — Telephone Encounter (Signed)
[facility] fax [facility] stating that patient did not keep her appointment on 7/12 for pap re-collection.

## 2017-04-15 NOTE — Telephone Encounter (Signed)
Orders

## 2017-10-10 IMAGING — US US PELVIS COMPLETE
1 series · 13 of 25 positions shown · non-contrast
Comparison: None.

CLINICAL DATA: Patient with suprapubic and left adnexal pain for 2
days.

EXAM:
TRANSABDOMINAL AND TRANSVAGINAL ULTRASOUND OF PELVIS
DOPPLER ULTRASOUND OF OVARIES
TECHNIQUE: Both transabdominal and transvaginal ultrasound examinations of the
pelvis were performed. Transabdominal technique was performed for
global imaging of the pelvis including uterus, ovaries, adnexal
regions, and pelvic cul-de-sac.
It was necessary to proceed with endovaginal exam following the
transabdominal exam to visualize the endometrium and adnexal
structures. Color and duplex Doppler ultrasound was utilized to
evaluate blood flow to the ovaries.

[Series 1: us pelvis complete · 0.06mm/px · 13 of 82 slices shown]
[im 1/82]
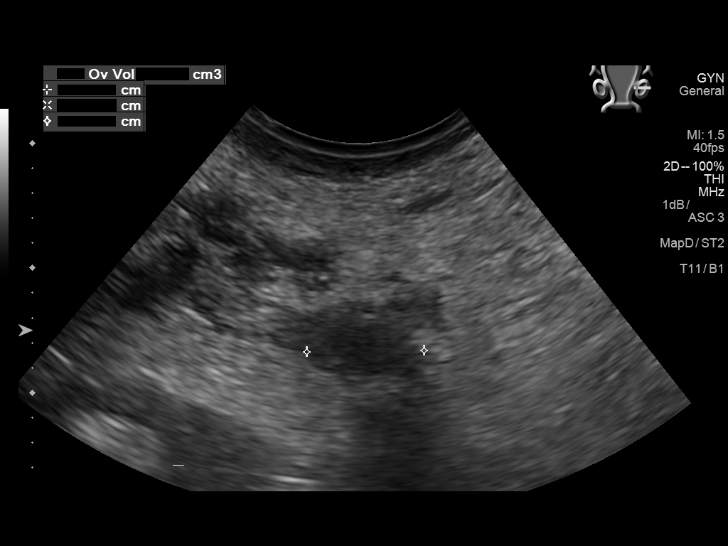
[im 7/82]
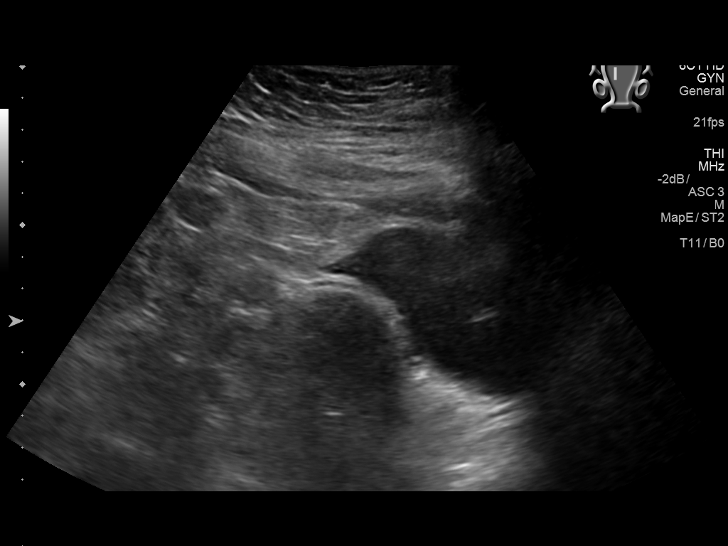
[im 14/82]
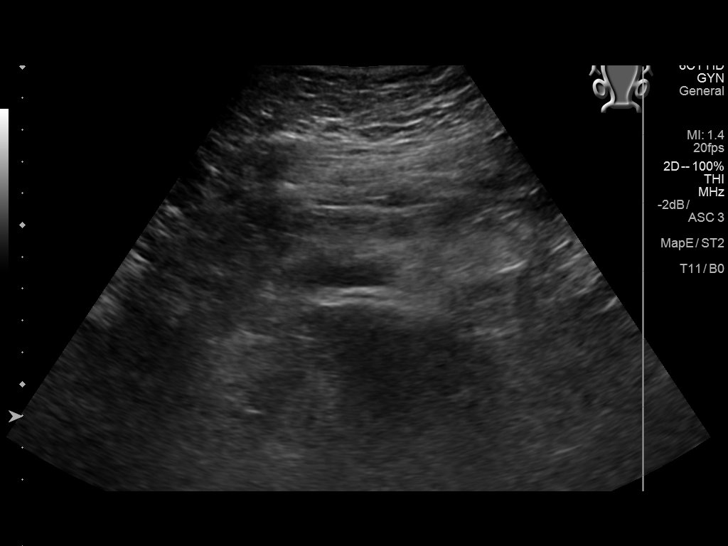
[im 21/82]
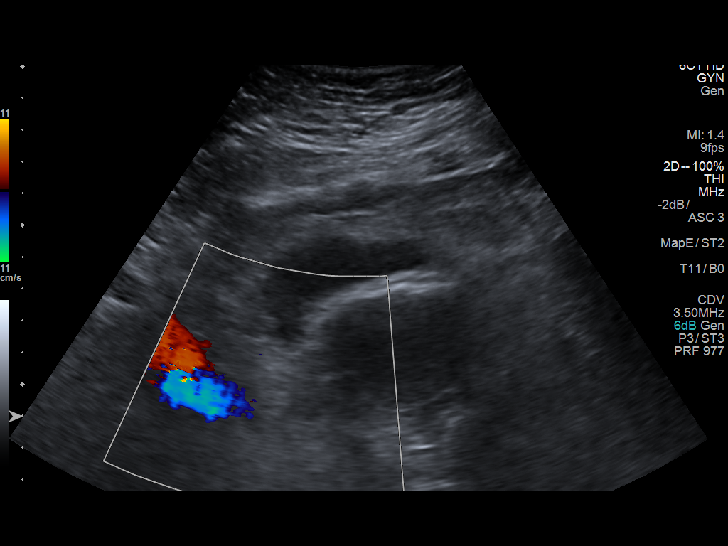
[im 28/82]
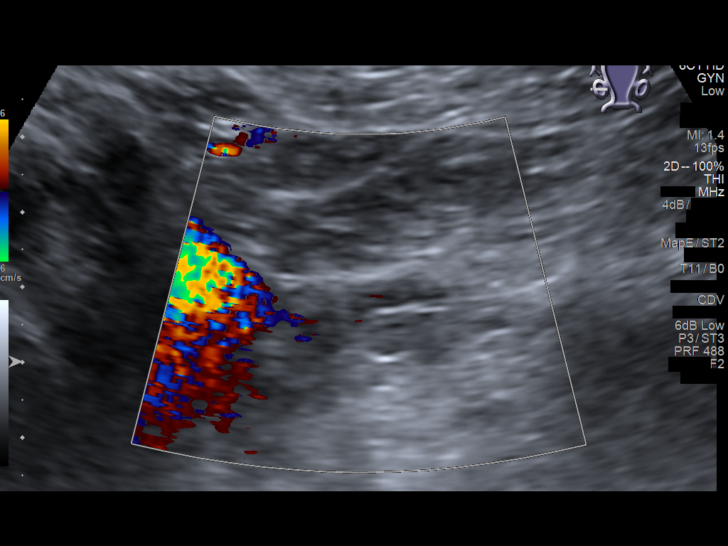
[im 34/82]
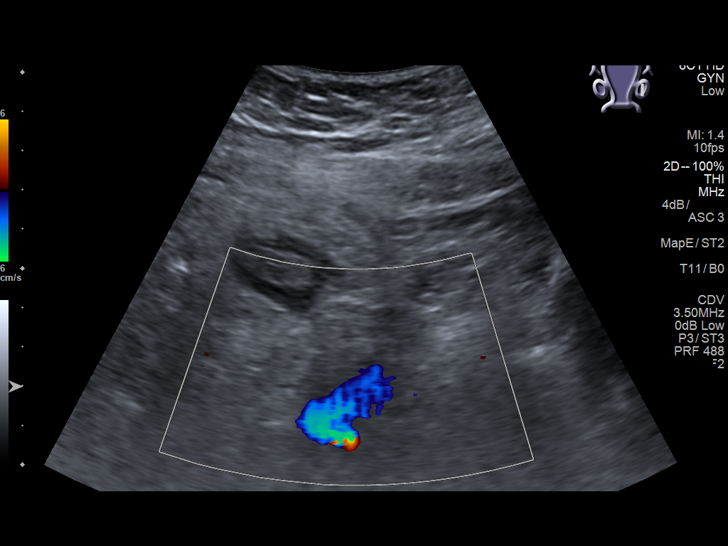
[im 41/82]
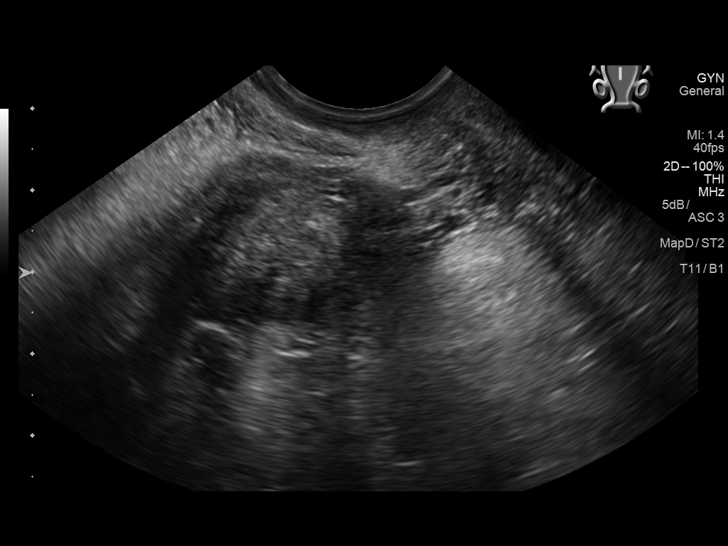
[im 48/82]
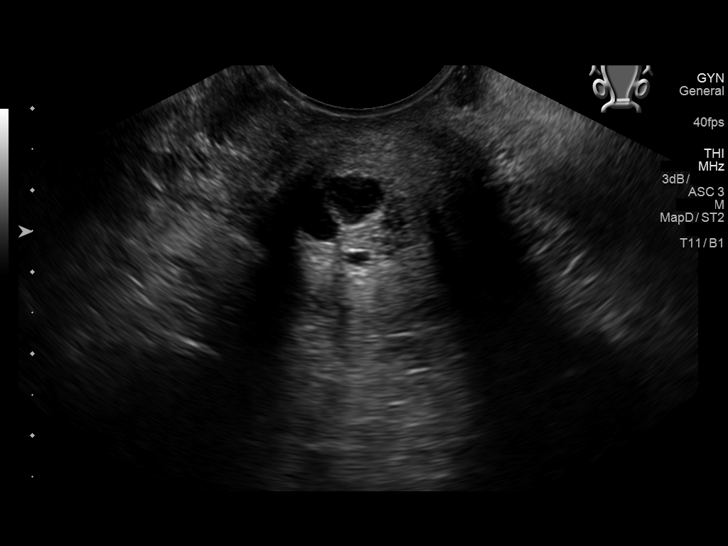
[im 55/82]
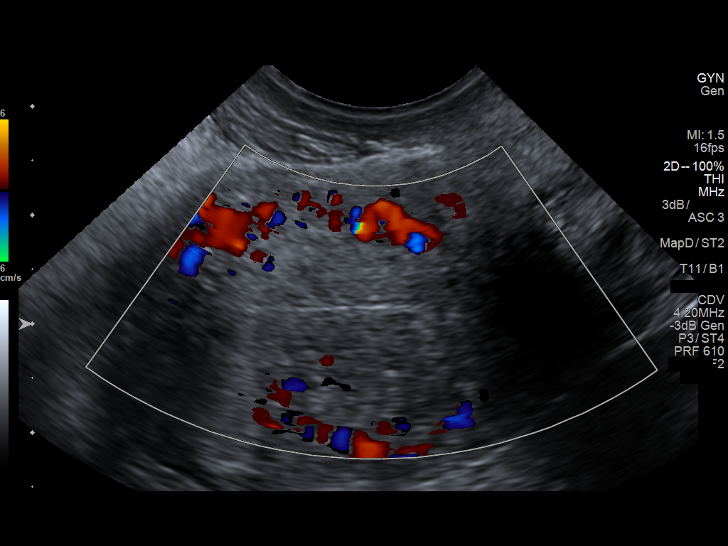
[im 61/82]
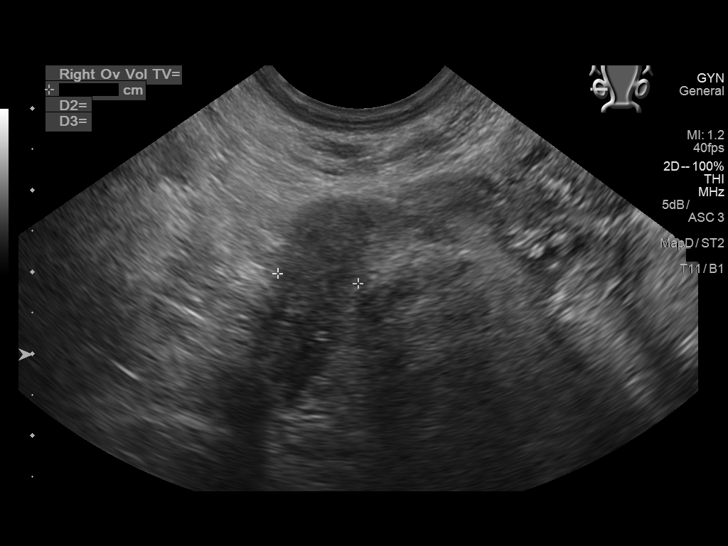
[im 68/82]
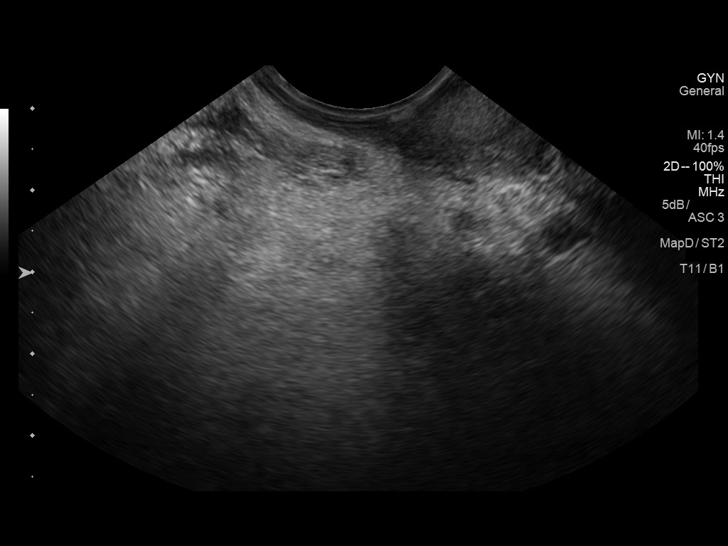
[im 75/82]
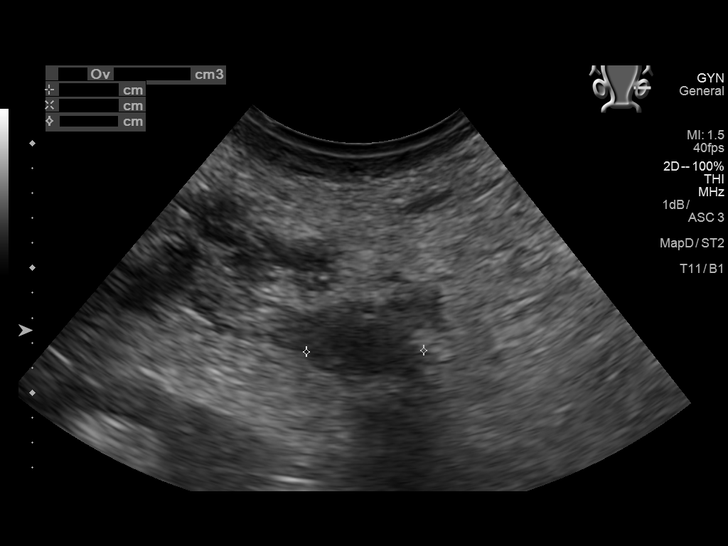
[im 82/82]
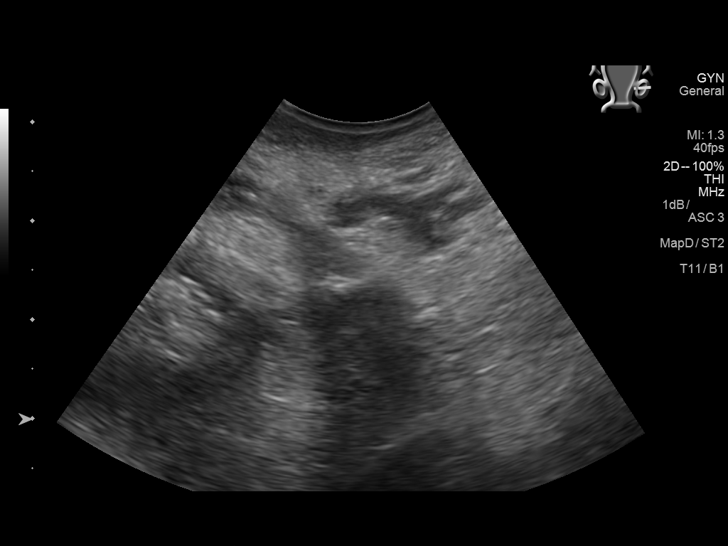

[13 of 25 positions shown; findings below may reference images not displayed]

FINDINGS: Uterus

Measurements: 6.3 x 3.5 x 3.9 cm. No fibroids or other mass
visualized.

Endometrium

Thickness: 3 mm.  No focal abnormality visualized.

Right ovary

Measurements: 1.0 x 0.9 x 1.5 cm. Normal appearance/no adnexal mass.

Left ovary

Measurements: 1.6 x 1.0 x 0.9 cm. Normal appearance/no adnexal mass.

Pulsed Doppler evaluation of both ovaries demonstrates normal
low-resistance arterial and venous waveforms.

Other findings

No abnormal free fluid.
IMPRESSION: No acute process.

Normal sonographic appearance of the ovaries.

Arterial and venous waveforms demonstrated within the left and right
ovaries.

## 2018-05-16 ENCOUNTER — Ambulatory Visit (INDEPENDENT_AMBULATORY_CARE_PROVIDER_SITE_OTHER): Payer: BLUE CROSS/BLUE SHIELD

## 2018-05-16 ENCOUNTER — Ambulatory Visit
Admission: EM | Admit: 2018-05-16 | Discharge: 2018-05-16 | Disposition: A | Discharge status: Home / Self Care | Payer: BLUE CROSS/BLUE SHIELD | Attending: Emergency Medicine | Admitting: Emergency Medicine

## 2018-05-16 ENCOUNTER — Encounter: Payer: Self-pay | Admitting: Gynecology

## 2018-05-16 DIAGNOSIS — R062 Wheezing: Secondary | ICD-10-CM

## 2018-05-16 DIAGNOSIS — J40 Bronchitis, not specified as acute or chronic: Secondary | ICD-10-CM

## 2018-05-16 DIAGNOSIS — R05 Cough: Secondary | ICD-10-CM

## 2018-05-16 MED ORDER — AEROCHAMBER PLUS MISC: 2 refills | Status: AC

## 2018-05-16 MED ORDER — ALBUTEROL SULFATE HFA 108 (90 BASE) MCG/ACT IN AERS
1.0000 | INHALATION_SPRAY | Freq: Four times a day (QID) | RESPIRATORY_TRACT | 0 refills | Status: AC | PRN
Start: 2018-05-16 — End: ?

## 2018-05-16 MED ORDER — IPRATROPIUM-ALBUTEROL 0.5-2.5 (3) MG/3ML IN SOLN
3.0000 mL | Freq: Once | RESPIRATORY_TRACT | Status: AC
  Administered 2018-05-16: 3 mL via RESPIRATORY_TRACT

## 2018-05-16 MED ORDER — PREDNISONE 10 MG PO TABS
60.0000 mg | ORAL_TABLET | Freq: Once | ORAL | Status: AC
  Administered 2018-05-16: 60 mg via ORAL

## 2018-05-16 MED ORDER — BENZONATATE 200 MG PO CAPS: 200.0000 mg | ORAL_CAPSULE | Freq: Three times a day (TID) | ORAL | 0 refills | Status: AC | PRN

## 2018-05-16 MED ORDER — PREDNISONE 50 MG PO TABS: 50.0000 mg | ORAL_TABLET | Freq: Every day | ORAL | 0 refills | Status: AC
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
## 2018-05-16 NOTE — Discharge Instructions (Addendum)
2 puffs from your albuterol inhaler every 4-6 hours as needed for coughing, wheezing.  Use your spacer.  Tessalon for the cough during the day, finish the steroids, even if you feel better.  Follow-up with a primary care physician of your choice, see list below.  Here is a list of primary care providers who are taking new patients:  Dr. Elizabeth Sauer, Dr. Schuyler Amor [address] [place] [address] [phone]  [facility] [address] [place] [address]  [phone]  [facility] [address] [place], [place] [address] [phone]  [facility] [address] [place]  [phone] [place], [place] [address]  Here are clinics/ other resources who will see you if you do not have insurance. Some have certain criteria that you must meet. Call them and find out what they are:  [facility]: [address] [place], [place] [address] Phone: [phone] Hours: First and Third Saturdays of each Month, 9 a.m. - 1 p.m.  [facility]: [address] [place], [place] [address] Phone: [phone] Hours: Tuesday, 4 p.m. - 8 p.m. Thursday, 1 p.m. - 8 p.m. Wednesday, 9 a.m. - [facility] [address], [place], [place] [address] Phone: [phone] Pharmacy Phone Number: [phone] Dental Phone Number: [phone] [facility] Insurance Help: [phone]  Dental Hours: Monday - Thursday, 8 a.m. - 6 p.m.  [facility] [address] [place], [place] [address] Phone: [phone] Pharmacy Phone Number: [phone] [facility] Insurance Help: [phone]  [facility] [address] [place] [place], [place] [address] Phone: [phone] Pharmacy Phone Number: [phone] [facility] Insurance Help: [phone]  [facility] [address] [place], [place] [address] Phone: [phone] [facility] Insurance Help: [phone]   [facility] [address] [place], [place] [address] Phone: [phone]  Go to www.goodrx.com to look up your medications. This will give you a list of where you can find your prescriptions at the most affordable prices. Or ask the pharmacist what the cash price is, or if they have any other discount programs available to help make your medication more affordable. This can be less expensive than what you would pay with insurance.

## 2018-05-28 ENCOUNTER — Other Ambulatory Visit: Payer: Self-pay

## 2018-05-28 ENCOUNTER — Emergency Department (HOSPITAL_COMMUNITY): Payer: BLUE CROSS/BLUE SHIELD

## 2018-05-28 ENCOUNTER — Emergency Department (HOSPITAL_COMMUNITY)
Admission: EM | Admit: 2018-05-28 | Discharge: 2018-05-28 | Disposition: A | Discharge status: Home / Self Care | Payer: BLUE CROSS/BLUE SHIELD | Attending: Emergency Medicine | Admitting: Emergency Medicine

## 2018-05-28 DIAGNOSIS — E039 Hypothyroidism, unspecified: Secondary | ICD-10-CM | POA: Insufficient documentation

## 2018-05-28 DIAGNOSIS — J45909 Unspecified asthma, uncomplicated: Secondary | ICD-10-CM | POA: Insufficient documentation

## 2018-05-28 DIAGNOSIS — R0789 Other chest pain: Secondary | ICD-10-CM | POA: Insufficient documentation

## 2018-05-28 DIAGNOSIS — R0602 Shortness of breath: Secondary | ICD-10-CM | POA: Diagnosis present

## 2018-05-28 DIAGNOSIS — R05 Cough: Secondary | ICD-10-CM | POA: Insufficient documentation

## 2018-05-28 DIAGNOSIS — R059 Cough, unspecified: Secondary | ICD-10-CM

## 2018-05-28 LAB — COMPREHENSIVE METABOLIC PANEL
ALT: 29 U/L (ref 0–44)
ANION GAP: 11 (ref 5–15)
AST: 33 U/L (ref 15–41)
Albumin: 4.1 g/dL (ref 3.5–5.0)
Alkaline Phosphatase: 40 U/L (ref 38–126)
BUN: 6 mg/dL (ref 6–20)
CO2: 21 mmol/L — AB (ref 22–32)
CREATININE: 0.78 mg/dL (ref 0.44–1.00)
Calcium: 9 mg/dL (ref 8.9–10.3)
Chloride: 109 mmol/L (ref 98–111)
Glucose, Bld: 96 mg/dL (ref 70–99)
POTASSIUM: 3.3 mmol/L — AB (ref 3.5–5.1)
SODIUM: 141 mmol/L (ref 135–145)
Total Bilirubin: 0.5 mg/dL (ref 0.3–1.2)
Total Protein: 7.4 g/dL (ref 6.5–8.1)

## 2018-05-28 LAB — CBC WITH DIFFERENTIAL/PLATELET
ABS IMMATURE GRANULOCYTES: 0.05 10*3/uL (ref 0.00–0.07)
Basophils Absolute: 0 10*3/uL (ref 0.0–0.1)
Basophils Relative: 0 %
EOS ABS: 0.1 10*3/uL (ref 0.0–0.5)
Eosinophils Relative: 1 %
HCT: 44.9 % (ref 36.0–46.0)
Hemoglobin: 14.3 g/dL (ref 12.0–15.0)
Immature Granulocytes: 0 %
Lymphocytes Relative: 34 %
Lymphs Abs: 4.8 10*3/uL — ABNORMAL HIGH (ref 0.7–4.0)
MCH: 30.8 pg (ref 26.0–34.0)
MCHC: 31.8 g/dL (ref 30.0–36.0)
MCV: 96.6 fL (ref 80.0–100.0)
MONO ABS: 0.9 10*3/uL (ref 0.1–1.0)
MONOS PCT: 6 %
NEUTROS ABS: 8.4 10*3/uL — AB (ref 1.7–7.7)
NEUTROS PCT: 59 %
PLATELETS: UNDETERMINED 10*3/uL (ref 150–400)
RBC: 4.65 MIL/uL (ref 3.87–5.11)
RDW: 13.2 % (ref 11.5–15.5)
WBC: 14.3 10*3/uL — AB (ref 4.0–10.5)
nRBC: 0 % (ref 0.0–0.2)

## 2018-05-28 LAB — POC URINE PREG, ED: PREG TEST UR: NEGATIVE

## 2018-05-28 LAB — BRAIN NATRIURETIC PEPTIDE: B NATRIURETIC PEPTIDE 5: 66.8 pg/mL (ref 0.0–100.0)

## 2018-05-28 LAB — TROPONIN I

## 2018-05-28 MED ORDER — BENZONATATE 100 MG PO CAPS
100.0000 mg | ORAL_CAPSULE | Freq: Once | ORAL | Status: AC
  Administered 2018-05-28: 100 mg via ORAL
  Filled 2018-05-28: qty 1

## 2018-05-28 MED ORDER — IOPAMIDOL (ISOVUE-370) INJECTION 76%
100.0000 mL | Freq: Once | INTRAVENOUS | Status: AC | PRN
  Administered 2018-05-28: 100 mL via INTRAVENOUS

## 2018-05-28 MED ORDER — IOPAMIDOL (ISOVUE-370) INJECTION 76%
INTRAVENOUS | Status: AC
  Filled 2018-05-28: qty 100

## 2018-05-28 MED ORDER — FENTANYL CITRATE (PF) 100 MCG/2ML IJ SOLN
50.0000 ug | Freq: Once | INTRAMUSCULAR | Status: AC
  Administered 2018-05-28: 50 ug via INTRAVENOUS
  Filled 2018-05-28: qty 2

## 2018-05-28 MED ORDER — ONDANSETRON HCL 4 MG/2ML IJ SOLN
4.0000 mg | Freq: Once | INTRAMUSCULAR | Status: AC
  Administered 2018-05-28: 4 mg via INTRAVENOUS
  Filled 2018-05-28: qty 2

## 2018-05-28 MED ORDER — HYDROCODONE-HOMATROPINE 5-1.5 MG/5ML PO SYRP: 5.0000 mL | ORAL_SOLUTION | Freq: Four times a day (QID) | ORAL | 0 refills | Status: AC | PRN

## 2018-05-28 MED ORDER — ALBUTEROL SULFATE (2.5 MG/3ML) 0.083% IN NEBU: 2.5000 mg | INHALATION_SOLUTION | Freq: Four times a day (QID) | RESPIRATORY_TRACT | 1 refills | Status: AC | PRN
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
## 2018-05-28 NOTE — ED Notes (Signed)
ED Provider at bedside. 

## 2018-05-28 NOTE — ED Notes (Signed)
Patient transported to CT 
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
## 2018-07-29 MED ORDER — CIPROFLOXACIN HCL 500 MG PO TABS
500.00 | ORAL_TABLET | ORAL | Status: DC
Start: 2018-07-29 — End: 2018-07-29

## 2018-07-29 MED ORDER — HYDROMORPHONE HCL 1 MG/ML IJ SOLN
0.25 | INTRAMUSCULAR | Status: DC
Start: ? — End: 2018-07-29

## 2018-07-29 MED ORDER — ONDANSETRON HCL 4 MG/2ML IJ SOLN
4.00 | INTRAMUSCULAR | Status: DC
Start: ? — End: 2018-07-29

## 2018-07-29 MED ORDER — METRONIDAZOLE 250 MG PO TABS
500.00 | ORAL_TABLET | ORAL | Status: DC
Start: 2018-07-29 — End: 2018-07-29

## 2018-07-29 MED ORDER — GENERIC EXTERNAL MEDICATION
40.00 | Status: DC
Start: 2018-07-30 — End: 2018-07-29

## 2018-07-29 MED ORDER — GENERIC EXTERNAL MEDICATION
6.25 | Status: DC
Start: ? — End: 2018-07-29

## 2018-08-02 ENCOUNTER — Other Ambulatory Visit: Payer: Self-pay | Admitting: Unknown Physician Specialty

## 2018-08-02 DIAGNOSIS — R490 Dysphonia: Secondary | ICD-10-CM

## 2018-08-04 ENCOUNTER — Ambulatory Visit: Payer: BLUE CROSS/BLUE SHIELD | Attending: Unknown Physician Specialty

## 2021-05-22 ENCOUNTER — Inpatient Hospital Stay: Payer: BC Managed Care – PPO

## 2021-05-22 ENCOUNTER — Inpatient Hospital Stay: Payer: BC Managed Care – PPO | Admitting: Oncology

## 2021-09-05 ENCOUNTER — Ambulatory Visit
Admission: RE | Admit: 2021-09-05 | Discharge: 2021-09-05 | Disposition: A | Discharge status: Home / Self Care | Payer: BC Managed Care – PPO | Source: Ambulatory Visit | Attending: Family Medicine | Admitting: Family Medicine

## 2021-09-05 ENCOUNTER — Other Ambulatory Visit: Payer: Self-pay | Admitting: Internal Medicine

## 2021-09-05 ENCOUNTER — Other Ambulatory Visit: Payer: Self-pay

## 2021-09-05 ENCOUNTER — Other Ambulatory Visit: Payer: Self-pay | Admitting: Family Medicine

## 2021-09-05 DIAGNOSIS — K5792 Diverticulitis of intestine, part unspecified, without perforation or abscess without bleeding: Secondary | ICD-10-CM | POA: Insufficient documentation

## 2021-09-05 MED ORDER — IOHEXOL 300 MG/ML  SOLN
100.0000 mL | Freq: Once | INTRAMUSCULAR | Status: AC | PRN
  Administered 2021-09-05: 100 mL via INTRAVENOUS

## 2024-02-11 IMAGING — CT CT ABD-PELV W/ CM
3 of 5 series · 16 of 46 positions shown, 18 images · IV contrast (agent unspecified)
Comparison: None.

CLINICAL DATA: Lower left-sided abdominal pain, vomiting

EXAM:
CT ABDOMEN AND PELVIS WITH CONTRAST
TECHNIQUE: Multidetector CT imaging of the abdomen and pelvis was performed
using the standard protocol following bolus administration of
intravenous contrast.

[Series 2: abd pelvis 5.00 · axial · 0.83mm/px · z∈[-1517,-1152]mm · 9 of 93 slices shown, 11 images]
[im 10/93  soft-tissue]
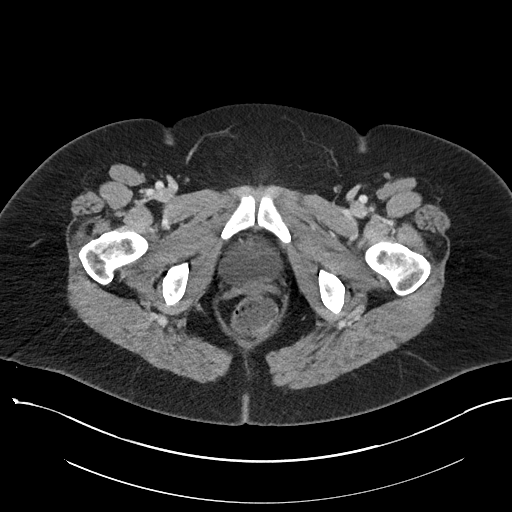
[im 10/93  bone]
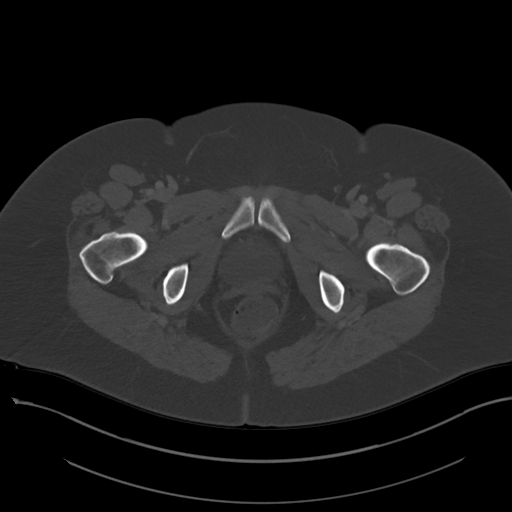
[im 19/93  soft-tissue]
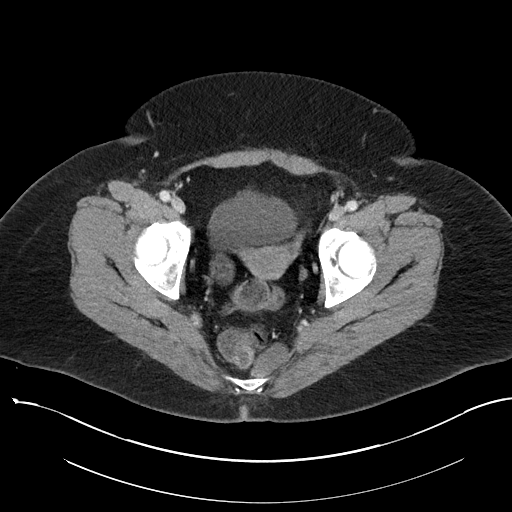
[im 28/93  soft-tissue]
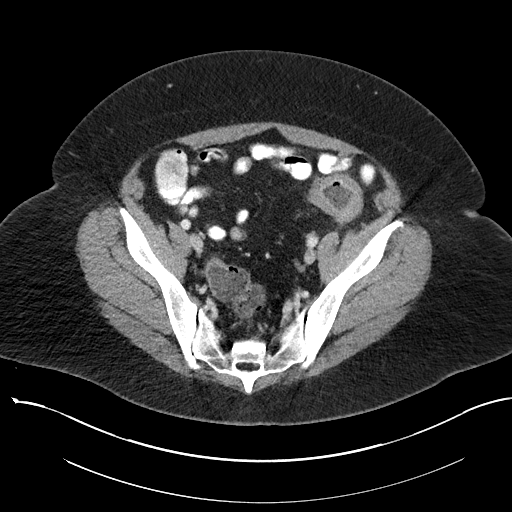
[im 37/93  soft-tissue]
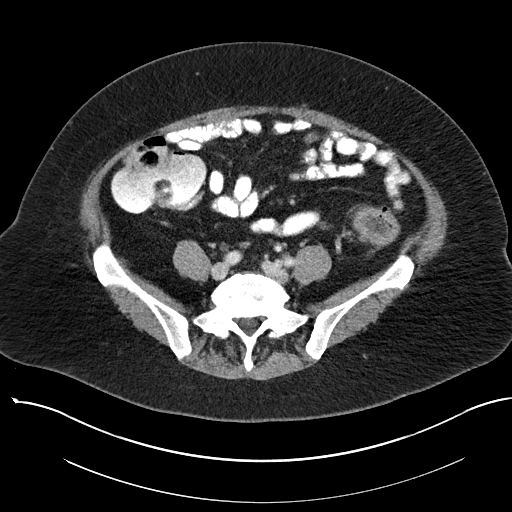
[im 47/93  soft-tissue]
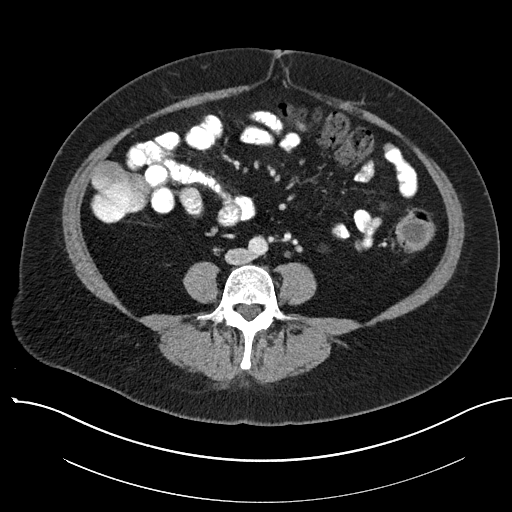
[im 56/93  soft-tissue]
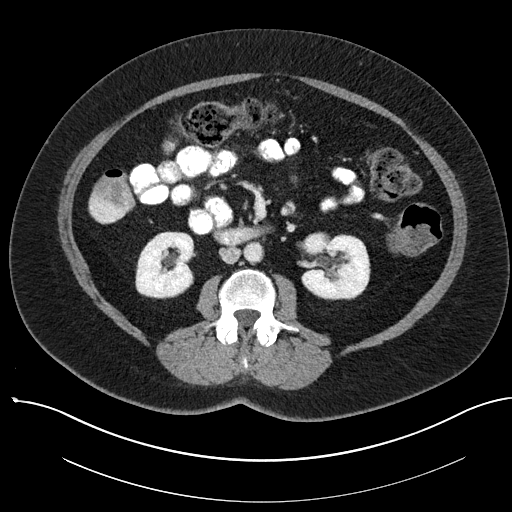
[im 65/93  soft-tissue]
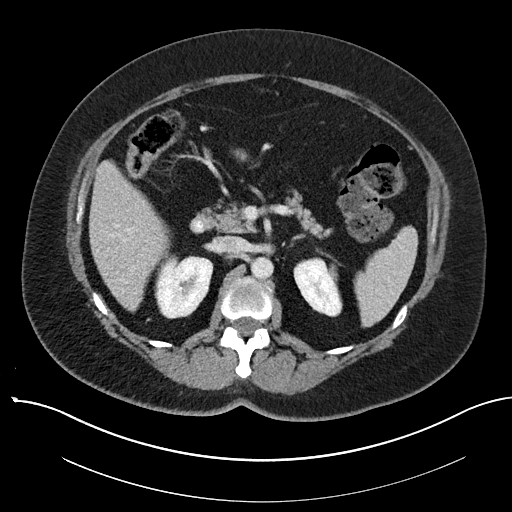
[im 74/93  soft-tissue]
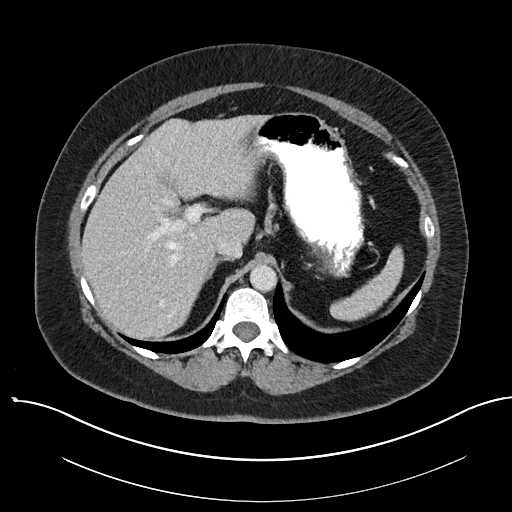
[im 83/93  soft-tissue]
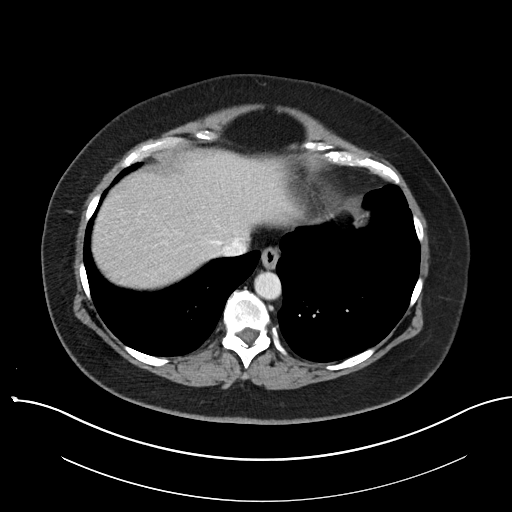
[im 83/93  bone]
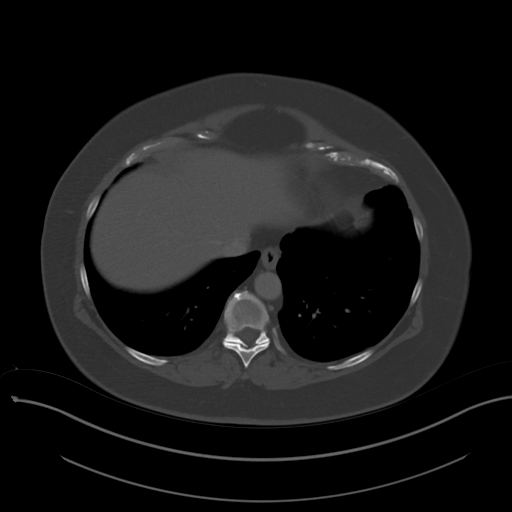

[Series 3: lungs abd pelvis 5.00 · axial · 0.83mm/px · z∈[-1517,-1382]mm · 4 of 93 slices shown]
[im 10/93  soft-tissue]
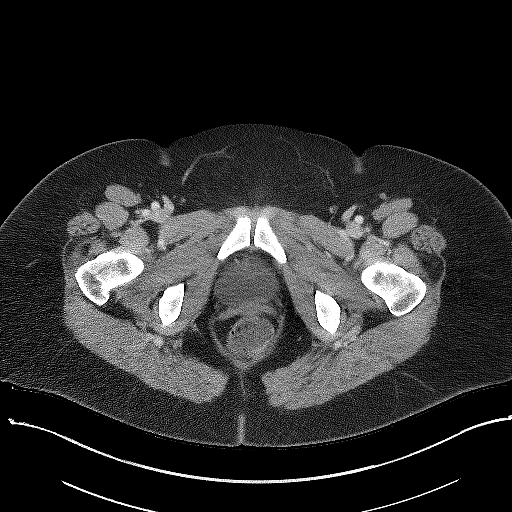
[im 19/93  soft-tissue]
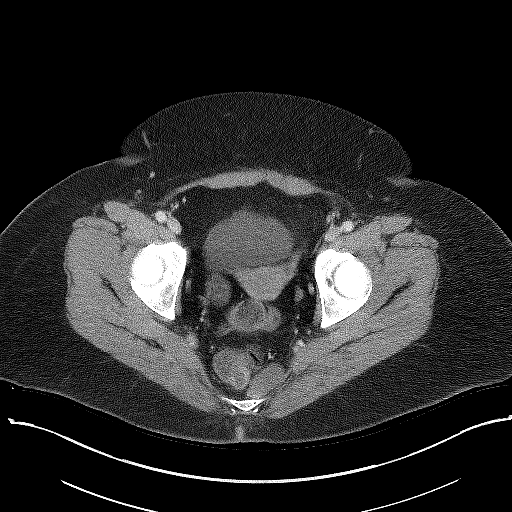
[im 28/93  soft-tissue]
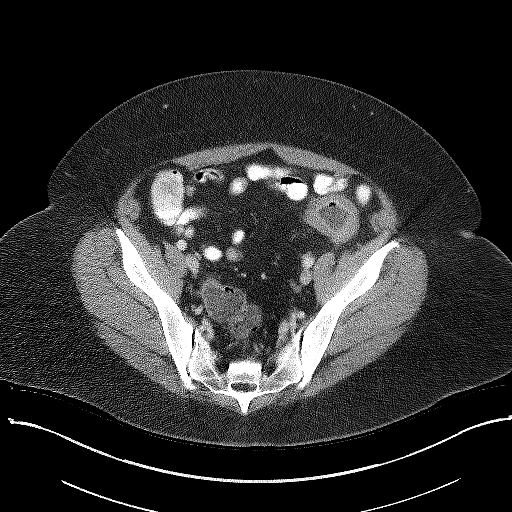
[im 37/93  soft-tissue]
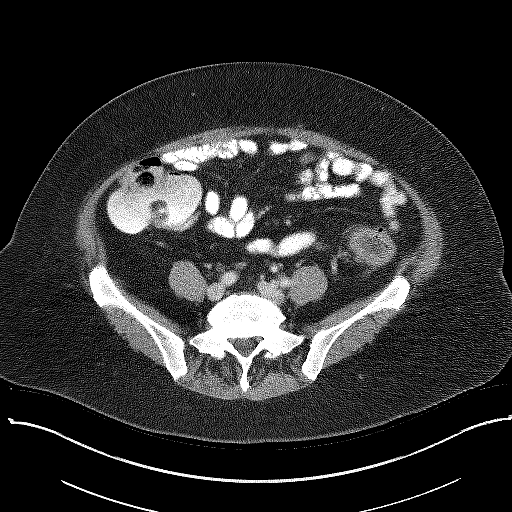

[Series 4: coronals abd pelvis 2.00 cor · coronal · 0.83mm/px · 3 of 169 slices shown]
[im 57/169  soft-tissue]
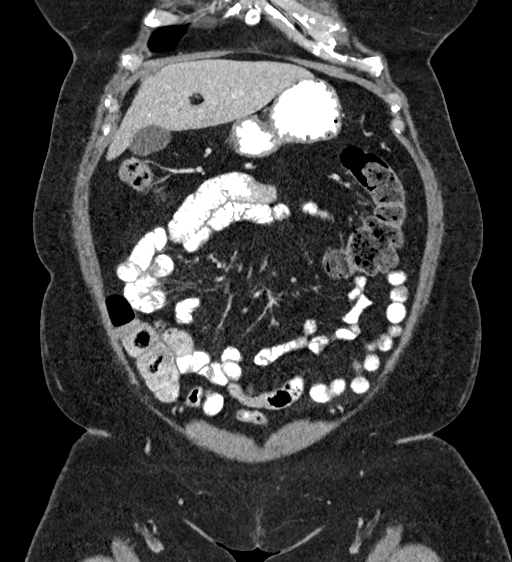
[im 75/169  soft-tissue]
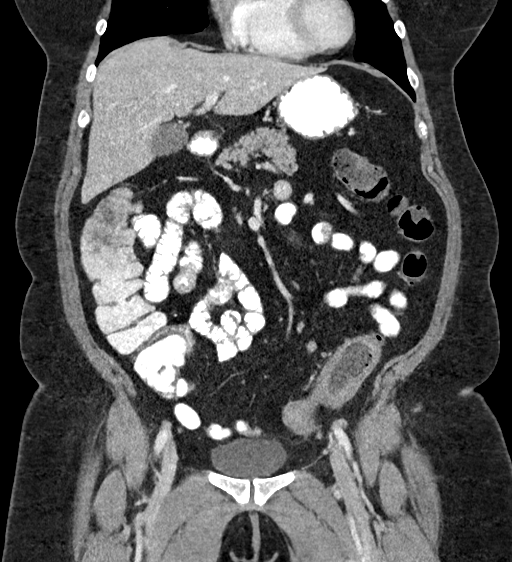
[im 94/169  soft-tissue]
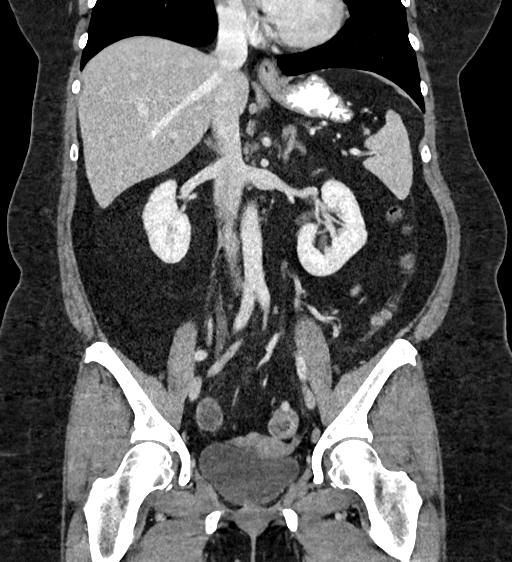

[16 of 46 positions shown; findings below may reference images not displayed]

RADIATION DOSE REDUCTION: This exam was performed according to the
departmental dose-optimization program which includes automated
exposure control, adjustment of the mA and/or kV according to
patient size and/or use of iterative reconstruction technique.

CONTRAST:  100mL OMNIPAQUE IOHEXOL 300 MG/ML  SOLN
FINDINGS: Lower chest: No acute pleural or parenchymal lung disease.

Hepatobiliary: No focal liver abnormality is seen. No gallstones,
gallbladder wall thickening, or biliary dilatation.

Pancreas: Unremarkable. No pancreatic ductal dilatation or
surrounding inflammatory changes.

Spleen: Normal in size without focal abnormality.

Adrenals/Urinary Tract: Adrenal glands are unremarkable. Kidneys are
normal, without renal calculi, focal lesion, or hydronephrosis.
Bladder is unremarkable.

Stomach/Bowel: No bowel obstruction or ileus. There is
circumferential wall thickening of the distal descending and
proximal sigmoid colon, with mild pericolonic fat stranding. This
could reflect diverticulitis or colitis. Normal appendix right lower
quadrant. Remaining portions of the bowel are unremarkable.

Vascular/Lymphatic: No significant vascular findings are present. No
enlarged abdominal or pelvic lymph nodes.

Reproductive: Uterus and bilateral adnexa are unremarkable.

Other: No free fluid or free gas. Small fat containing umbilical
hernia and left inguinal hernia. No bowel herniation.

Musculoskeletal: No acute or destructive bony lesions. Reconstructed
images demonstrate no additional findings.
IMPRESSION: 1. Segmental wall thickening and pericolonic fat stranding involving
the distal descending and proximal sigmoid colon. Findings could
reflect inflammatory or infectious colitis, versus diverticulitis.
No perforation, fluid collection, or abscess.
2. Small fat containing umbilical and left inguinal hernias. No
bowel herniation.
